# Patient Record
Sex: Female | Born: 1940 | Race: White | Hispanic: No
Health system: Midwestern US, Community
[De-identification: ages and names within clinical notes are randomized; demographics above are authoritative.]

## PROBLEM LIST (undated history)

## (undated) DIAGNOSIS — R922 Inconclusive mammogram: Secondary | ICD-10-CM

## (undated) DIAGNOSIS — E7800 Pure hypercholesterolemia, unspecified: Secondary | ICD-10-CM

## (undated) DIAGNOSIS — E119 Type 2 diabetes mellitus without complications: Secondary | ICD-10-CM

## (undated) DIAGNOSIS — Z87898 Personal history of other specified conditions: Secondary | ICD-10-CM

## (undated) DIAGNOSIS — J209 Acute bronchitis, unspecified: Secondary | ICD-10-CM

## (undated) DIAGNOSIS — R7301 Impaired fasting glucose: Secondary | ICD-10-CM

## (undated) DIAGNOSIS — R059 Cough, unspecified: Secondary | ICD-10-CM

## (undated) DIAGNOSIS — M8589 Other specified disorders of bone density and structure, multiple sites: Secondary | ICD-10-CM

---

## 2010-08-09 LAB — HM COLONOSCOPY

## 2012-07-29 LAB — HM MAMMOGRAPHY

## 2013-08-07 LAB — HM DEXA SCAN

## 2013-08-10 LAB — HM DEXA SCAN
Amb DEXA, External: -1.1
BONE DENSITY, EXTERNAL: -1.1

## 2014-02-08 LAB — HM MAMMOGRAPHY

## 2014-03-10 LAB — AMB EXT HGBA1C: Hemoglobin A1c, External: 6.1

## 2014-06-29 NOTE — Progress Notes (Unsigned)
HISTORY OF PRESENT ILLNESS  Melissa Mclean is a 74 y.o. female.  HPI    ROS    Physical Exam    ASSESSMENT and PLAN  {ASSESSMENT/PLAN:19072}

## 2014-06-30 ENCOUNTER — Ambulatory Visit: Admit: 2014-06-30 | Discharge: 2014-06-30 | Payer: MEDICARE | Attending: Internal Medicine | Primary: Internal Medicine

## 2014-06-30 DIAGNOSIS — E119 Type 2 diabetes mellitus without complications: Secondary | ICD-10-CM

## 2014-06-30 NOTE — Progress Notes (Signed)
Subjective:      HPI:  Melissa StadeCarol Mclean is a 74 y.o. female who had concerns including Leg Swelling.. Left leg edema   Bad URI in [place] this winter took zpak then treated with 10 day course then an inhaler and prednisone. On prednisone for 5 days then wheeze and infection resolved. Then noticed ankle swelling and saw doctor had lab- elctrolytes fine. Stopped furosemide treated instead with aldactone/hct. Request[facility] be [facility]. No shortness of breath now.  Had injections in knees for 6 weeks with 6 weeks pt- back in the pool.  Seeing foot doctor. Left hip replacement leg a little lower. Weight stable  Sees dr Simone Curiameghan buhler- [phone]      No Known Allergies     Family History   Problem Relation Age of Onset   ??? Hypertension Mother    ??? Colon Cancer Sister      History     Social History   ??? Marital Status: MARRIED     Spouse Name: N/A   ??? Number of Children: N/A   ??? Years of Education: N/A     Social History Main Topics   ??? Smoking status: Former Smoker -- 0.10 packs/day for 15 years     Types: Cigarettes     Quit date: 06/29/2012   ??? Smokeless tobacco: Not on file   ??? Alcohol Use: No   ??? Drug Use: Not on file   ??? Sexual Activity: Not on file     Other Topics Concern   ??? None     Social History Narrative     Past Surgical History   Procedure Laterality Date   ??? Hx breast biopsy     ??? Hx lap cholecystectomy     ??? Hx parathyroidectomy     ??? Hx hip replacement       Past Medical History   Diagnosis Date   ??? Osteoporosis    ??? Overweight    ??? Diabetes mellitus (HCC)    ??? Hypertension    ??? High cholesterol    ??? Hypothyroid      Current Outpatient Prescriptions   Medication Sig Dispense Refill   ??? levothyroxine (SYNTHROID) 125 mcg tablet Take  by mouth Daily (before breakfast).     ??? spironolactone-hydrochlorothiazide (ALDACTAZIDE) 25-25 mg per tablet Take 1 Tab by mouth daily.     ??? multivitamin (ONE A DAY) tablet Take 1 Tab by mouth daily.      ??? cholecalciferol (VITAMIN D3) 1,000 unit cap Take  by mouth daily.     ??? cyanocobalamin (VITAMIN B-12) 1,000 mcg sublingual tablet Take 1,000 mcg by mouth daily.     ??? losartan (COZAAR) 100 mg tablet Take 100 mg by mouth daily.     ??? B.infantis-B.ani-B.long-B.bifi 10-15 mg TbEC Take  by mouth.         REVIEW OF SYSTEMS:  Constitrutional:  No fatigue no fever no weight loss  Sleep: no sleep apnea no insomnia  Eyes:  No blurry vision or eye pain  ENT: No dysphagia  Resp: No SOB, no asthma, no wheeze  Cardiac:  No chest pain, palpitations or syncope  GI:  No nausea or vomiting, no blood in stool  GU:  No dysuria or hematuria  Endo: thyroid and diabetes a1c 6.1  Heme-lymph: No ecchymosis or bleeding, no lymphadenopathy  Skin:  No rash, no change in moles  Neuro: No headaches, weakness, or vertigo  MS- knee arthritis with hyalgan injection s/p  THR  Psych: No Depression, no anxiety, No dementia  Allergy/immunologic: No frequent infections. No hayfever    BP 132/84 mmHg   Pulse 103   Ht  (1.727 m)   Wt 274 lb 8 oz (124.512 kg)   BMI 41.75 kg/m2   SpO2 94%    Objective:   PHYSICAL EXAM:  Const: WDWN no acute distress  Skin: no rash no change in moles  HEENT: mouth clear, pharynx neg  Neck: no nodes supple  Breast:  Lungs: clear, no wheeze  Heart:: RRR S1S2 no murmur  Abdomen: Soft nontender non distended no hepatosplenomegaly   GU: Not performed  Musculoskeletal:    C-spine:   L-spine   Joints: THR   Muscles:    No results found for any previous visit.  No results found for this or any previous visit.    Assessment/Plan:   . Darina was in [place] this winter and recovering from bronchitis and episode of bronchospasm. Treated with prednisone now resolved but developed edema. Furosemide changed to aldactone/hct much better now. Will see in follow up. Will review lab from [facility] when it arrives. Call if chagnes.    Patient Active Problem List   Diagnosis Code   ??? Diabetes mellitus (HCC) E11.9   ??? Hypertension I10    ??? Hypothyroid E03.9   ??? Osteoporosis M81.0   ??? Edema R60.9       Follow-up Disposition: Not on File     Edwin Cap, MD

## 2014-06-30 NOTE — Progress Notes (Unsigned)
Subjective:      HPI:  Melissa Mclean is a 74 y.o. female who had no chief complaint listed for this encounter..    Allergies no known allergies     Family History   Problem Relation Age of Onset   ??? Hypertension Mother    ??? Colon Cancer Sister      History     Social History   ??? Marital Status: N/A     Spouse Name: N/A   ??? Number of Children: N/A   ??? Years of Education: N/A     Social History Main Topics   ??? Smoking status: Never Smoker    ??? Smokeless tobacco: Not on file   ??? Alcohol Use: No   ??? Drug Use: Not on file   ??? Sexual Activity: Not on file     Other Topics Concern   ??? Not on file     Social History Narrative   ??? No narrative on file     Past Surgical History   Procedure Laterality Date   ??? Hx breast biopsy       Past Medical History   Diagnosis Date   ??? Diabetes mellitus (HCC)    ??? Hypertension    ??? High cholesterol    ??? Hypothyroid    ??? Osteoporosis    ??? Overweight      Current Outpatient Prescriptions   Medication Sig Dispense Refill   ??? cholecalciferol (VITAMIN D3) 1,000 unit cap Take  by mouth daily.     ??? cyanocobalamin (VITAMIN B-12) 1,000 mcg sublingual tablet Take 1,000 mcg by mouth daily.     ??? losartan (COZAAR) 100 mg tablet Take 100 mg by mouth daily.     ??? furosemide (LASIX) 10 mg/mL oral solution Take  by mouth daily.     ??? cyclobenzaprine (FLEXERIL) 10 mg tablet Take  by mouth three (3) times daily as needed for Muscle Spasm(s).         REVIEW OF SYSTEMS:  Constitrutional:  No fatigue no fever no weight loss  Sleep: no sleep apnea no insomnia  Eyes:  No blurry vision or eye pain  ENT: No dysphagia  Resp: No SOB, no asthma, no wheeze  Cardiac:  No chest pain, palpitations or syncope  GI:  No nausea or vomiting, no blood in stool  GU:  No dysuria or hematuria  Endo: No hypothyroid no diabetes  Heme-lymph: No ecchymosis or bleeding, no lymphadenopathy  Skin:  No rash, no change in moles  Neuro: No headaches, weakness, or vertigo  Psych: No Depression, no anxiety, No dementia   Allergy/immunologic: No frequent infections. No hayfever    BP 122/88 mmHg   Pulse 96   Wt 263 lb (119.296 kg)   SpO2 97%    Objective:   PHYSICAL EXAM:  Const: WDWN no acute distress  Skin: no rash no change in moles  HEENT: mouth clear, pharynx neg  Neck: no nodes supple  Breast:  Lungs: clear, no wheeze  Heart:: RRR S1S2 no murmur  Abdomen: Soft nontender non distended no hepatosplenomegaly   GU: Not performed  Musculoskeletal:    C-spine:   L-spine   Joints:   Muscles:  Lymphadenopathy: no nodes  Rectal:   Neuro: non focal    cranial nerves:   Sensory:   Motor:   Gait:  Psych: no dementia no depression no anxiety    No results found for any previous visit.  No results found for this or any  previous visit.    Assessment/Plan:   {Assessment and Plan:19072}.    There is no problem list on file for this patient.      Follow-up Disposition: Not on File     Edwin CapEMILY Jamaree Hosier, MD

## 2014-07-29 MED ORDER — SPIRONOLACTON-HYDROCHLOROTHIAZ 25 MG-25 MG TAB
25-25 mg | ORAL_TABLET | Freq: Every day | ORAL | Status: DC
Start: 2014-07-29 — End: 2015-01-26

## 2014-07-29 NOTE — Telephone Encounter (Signed)
rx escribed

## 2014-07-29 NOTE — Telephone Encounter (Signed)
To MD

## 2014-07-29 NOTE — Telephone Encounter (Signed)
Yes please send in 90 x 3 refills 1 every day to pharmacy

## 2014-07-29 NOTE — Telephone Encounter (Signed)
Pt called stating that she needs Dr Roger ShelterGordon to call in her spironolactone-hztz 25-25 tabs. States that [place] in [place] do this, but she is in [place] now.  As[facility] [address]

## 2014-08-09 ENCOUNTER — Ambulatory Visit: Admit: 2014-08-09 | Discharge: 2014-08-09 | Payer: MEDICARE | Attending: Internal Medicine | Primary: Internal Medicine

## 2014-08-09 DIAGNOSIS — E119 Type 2 diabetes mellitus without complications: Secondary | ICD-10-CM

## 2014-08-09 MED ORDER — METFORMIN SR 750 MG 24 HR TABLET
750 mg | ORAL_TABLET | Freq: Every day | ORAL | Status: DC
Start: 2014-08-09 — End: 2015-01-26

## 2014-08-09 NOTE — Progress Notes (Addendum)
Chief Complaint   Patient presents with   ??? Hypotension       HPI:   Edema resolved, bronchospasm resolved  Had lab on aldactone/hct 3/16 cmp nl bcc nl k 4.0 12/15 celiac neg, chol 262/64/166/158 a1c 6.1 tsh 2.1 cbc nl b12 440 d 37   Doing well with diet and exercise   Joining weight watchers    ROS- edema resolved  bronchospasm resolved  No hcest pain no shortness of breath  Due for colonscopy - + polyps discussed    HISTORIES & HABITS  Medical History:   Past Medical History   Diagnosis Date   ??? Osteoporosis    ??? Overweight    ??? Diabetes mellitus (HCC)    ??? Hypertension    ??? High cholesterol    ??? Hypothyroid    ??? Osteoarthritis      had bilateral injections hyalgan in knees     Surgery History:   Past Surgical History   Procedure Laterality Date   ??? Hx breast biopsy     ??? Hx lap cholecystectomy     ??? Hx parathyroidectomy     ??? Hx hip replacement     ??? Hx colonoscopy  11/2013     Family History:   Family History   Problem Relation Age of Onset   ??? Hypertension Mother    ??? Colon Cancer Sister      History     Social History   ??? Marital Status: MARRIED     Spouse Name: N/A   ??? Number of Children: N/A   ??? Years of Education: N/A     Social History Main Topics   ??? Smoking status: Former Smoker -- 0.10 packs/day for 15 years     Types: Cigarettes     Quit date: 06/29/2012   ??? Smokeless tobacco: Not on file   ??? Alcohol Use: No   ??? Drug Use: Not on file   ??? Sexual Activity: Not on file     Other Topics Concern   ??? None     Social History Narrative     Health Maintenance   Topic Date Due   ??? LIPID PANEL Q1  December 04, 1940   ??? FOOT EXAM Q1  05/12/1951   ??? MICROALBUMIN Q1  05/12/1951   ??? EYE EXAM RETINAL OR DILATED Q1  05/12/1951   ??? DTaP/Tdap/Td series (1 - Tdap) 05/11/1962   ??? FOBT Q 1 YEAR AGE 70-75  05/12/1991   ??? GLAUCOMA SCREENING Q2Y  05/11/2006   ??? MEDICARE YEARLY EXAM  05/11/2006   ??? HEMOGLOBIN A1C Q6M  09/09/2014   ??? INFLUENZA AGE 75 TO ADULT  10/25/2014   ??? Pneumococcal 65+ Low/Medium Risk (2 of 2 - PPSV23) 11/30/2014    ??? BREAST CANCER SCRN MAMMOGRAM  02/09/2016   ??? OSTEOPOROSIS SCREENING (DEXA)  Completed   ??? ZOSTER VACCINE AGE 8>  Completed     No Known Allergies    Current outpatient prescriptions:   ???  metFORMIN ER (GLUCOPHAGE XR) 750 mg tablet, Take 1 Tab by mouth daily., Disp: 90 Tab, Rfl: 3  ???  spironolactone-hydrochlorothiazide (ALDACTAZIDE) 25-25 mg per tablet, Take 1 Tab by mouth daily., Disp: 90 Tab, Rfl: 3  ???  levothyroxine (SYNTHROID) 125 mcg tablet, Take  by mouth Daily (before breakfast)., Disp: , Rfl:   ???  multivitamin (ONE A DAY) tablet, Take 1 Tab by mouth daily., Disp: , Rfl:   ???  B.infantis-B.ani-B.long-B.bifi 10-15 mg TbEC, Take  by  mouth., Disp: , Rfl:   ???  cholecalciferol (VITAMIN D3) 1,000 unit cap, Take  by mouth daily., Disp: , Rfl:   ???  cyanocobalamin (VITAMIN B-12) 1,000 mcg sublingual tablet, Take 1,000 mcg by mouth daily., Disp: , Rfl:   ???  losartan (COZAAR) 100 mg tablet, Take 100 mg by mouth daily., Disp: , Rfl:       BP 120/70 mmHg   Pulse 104   Ht 5\' 8"  (1.727 m)   Wt 123.378 kg (272 lb)   BMI 41.37 kg/m2   SpO2 97%  Physical Exam  General: well nourished, well hydrated, no acute distress  Head: normocephalic  Eye: conjunctivae and lids normal  ZOX:WRUEENT:Oral Cavity: no lesion, pharynx normal  Neck: supple, no masses, no lymphadenopathy  Respiratory: no rales, rhonchi, or wheezes  Cardiovascular: RRR, S1 and S2 normal, no murmur  Peripheral circulation: no cyanosis, clubbing, edema  Skin: no rashes, change in moles  Neurologic: non focal  Cranial nerves: II - XII grossly intact      Chief Complaint   Patient presents with   ??? Hypotension       Assessment-Plan:bp too low on aldactixzide- now breaking in half- feels better  High cholesterol discussed diet nad books rec- weigh tloss   Will repeat lab a1c and lipid 3 months  Start metformin er 750 with dinner- risk of loose bowels discussed    5.16 255/65/155/168 a1c 6.0 cmp nl tsh 2.0  11.16 cmp nl a1c 6.1 chol 252/58/163/157 tsh 1.2 ca 10.1   Spoke to pt lightheaded on metofrmin may stop  Lost 5 pounds on weight watchers      Encounter Diagnoses     ICD-10-CM ICD-9-CM   1. Type 2 diabetes mellitus without complication (HCC) E11.9 250.00   2. Essential hypertension with goal blood pressure less than 130/80 I10 401.9   3. Hypothyroidism due to Hashimoto's thyroiditis E03.8 244.8    E06.3 245.2   4. Osteoporosis M81.0 733.00   5. Edema, unspecified type R60.9 782.3     Orders Placed This Encounter   ??? HM MAMMOGRAPHY     This external order was created through the Results Console.   ??? HEMOGLOBIN A1C W/O EAG   ??? LIPID PANEL   ??? TSH 3RD GENERATION   ??? METABOLIC PANEL, COMPREHENSIVE   ??? AMB EXT HGBA1C     This external order was created through the Results Console.   ??? HM COLONOSCOPY     This external order was created through the Results Console.   ??? metFORMIN ER (GLUCOPHAGE XR) 750 mg tablet     Sig: Take 1 Tab by mouth daily.     Dispense:  90 Tab     Refill:  3       Sincerely,    Edwin CapEmily Mattalynn Crandle, MD  Note Signed Electronically

## 2014-08-09 NOTE — Patient Instructions (Signed)
Check lab in 3 months  Weight loss

## 2014-08-26 LAB — METABOLIC PANEL, COMPREHENSIVE
A-G Ratio: 1.7 Ratio (ref 0.9–2.4)
ALT (SGPT): 19 IU/L (ref 6–40)
AST (SGOT): 17 IU/L (ref 10–35)
Albumin: 4.2 g/dL (ref 3.3–5.1)
Alk. phosphatase: 61 IU/L (ref 33–130)
BUN/Creatinine ratio: 33.3 (ref 5.3–50.0)
BUN: 26 mg/dL — ABNORMAL HIGH (ref 7–25)
Bilirubin, total: 0.6 mg/dL (ref 0.2–1.2)
CO2: 26 mmol/L (ref 21–33)
Calcium: 10.1 mg/dL (ref 8.6–10.4)
Chloride: 102 mmol/L (ref 98–110)
Creatinine: 0.78 mg/dL (ref 0.50–1.30)
Estimated GFR: 72 (ref >60)
Globulin: 2.5 g/dL (ref 1.8–3.6)
Glucose: 117 mg/dL — ABNORMAL HIGH (ref 65–99)
Potassium: 4.8 mmol/L (ref 3.5–5.5)
Protein, total: 6.7 g/dL (ref 6.2–8.3)
Sodium: 137 mmol/L (ref 135–146)
eGFR If African American: 88 (ref >60)

## 2014-08-26 LAB — LIPID PANEL
Cholesterol, total: 254 mg/dL — ABNORMAL HIGH (ref 125–200)
HDL Chol (%): 25.6 % (ref >15)
HDL Cholesterol: 65 mg/dL (ref >=46)
LDL, calculated: 155 mg/dL — ABNORMAL HIGH (ref <130)
Non-HDL Cholesterol: 189 mg/dL
T. Chol/HDL Ratio: 3.9 Ratio (ref <5.00)
Triglyceride: 168 mg/dL — ABNORMAL HIGH (ref <150)
VLDL, calculated: 34 (ref 8–35)

## 2014-08-26 LAB — TSH 3RD GENERATION: TSH: 2.01 u[IU]/mL (ref 0.36–4.42)

## 2014-08-26 LAB — ESTIMATED AVERAGE GLUCOSE (SHIEL ONLY): Estimated average glucose: 126 mg/dL

## 2014-08-26 LAB — HEMOGLOBIN A1C W/O EAG: Hemoglobin A1c: 6 % (ref 4.0–6.0)

## 2014-09-16 ENCOUNTER — Telehealth

## 2014-09-16 NOTE — Addendum Note (Signed)
Addended by: Vicie Mutters on: 09/16/2014 04:22 PM      Modules accepted: Orders

## 2014-09-16 NOTE — Addendum Note (Signed)
Addended by: Vicie Mutters on: 09/16/2014 03:29 PM      Modules accepted: Orders

## 2014-09-16 NOTE — Telephone Encounter (Signed)
Pt called to request script for annual mammogram and diagnostic mammogram. She asked it to be faxed to [phone].

## 2014-09-16 NOTE — Telephone Encounter (Signed)
Yes yes

## 2014-09-16 NOTE — Telephone Encounter (Signed)
yes

## 2014-09-16 NOTE — Telephone Encounter (Signed)
mammo ordered and faxed

## 2014-09-16 NOTE — Telephone Encounter (Signed)
To md, ok to order

## 2014-09-17 ENCOUNTER — Telehealth

## 2014-09-17 NOTE — Telephone Encounter (Signed)
See previous encounter.    Megan from [facility] called stating that they need script for [place] to go with diagnostic mammo.

## 2014-09-17 NOTE — Telephone Encounter (Signed)
faxed

## 2014-09-21 NOTE — Telephone Encounter (Signed)
Maure[facility]ivian at [facility] called. Wanted to make Dr Roger ShelterGordon aware that patient has not returned any of her phone calls regarding scheduling her diagnostic mammo.

## 2014-10-13 ENCOUNTER — Encounter

## 2014-10-26 LAB — HM MAMMOGRAPHY

## 2014-11-08 MED ORDER — LOSARTAN 100 MG TAB
100 mg | ORAL_TABLET | Freq: Every day | ORAL | 3 refills | Status: DC
Start: 2014-11-08 — End: 2015-01-26

## 2014-11-24 ENCOUNTER — Encounter

## 2014-12-01 ENCOUNTER — Ambulatory Visit: Attending: Internal Medicine | Primary: Internal Medicine

## 2014-12-02 NOTE — Progress Notes (Signed)
A user error has taken place: encounter opened in error, closed for administrative reasons.

## 2015-01-19 ENCOUNTER — Telehealth

## 2015-01-19 NOTE — Telephone Encounter (Signed)
Printed -please mail

## 2015-01-19 NOTE — Telephone Encounter (Signed)
Patient called requesting a script for labs to please be mailed to her home.

## 2015-01-20 NOTE — Telephone Encounter (Signed)
Mailed to pt's home

## 2015-01-25 LAB — LIPID PANEL
Cholesterol, total: 252 mg/dL — ABNORMAL HIGH (ref 125–200)
HDL Chol (%): 23 % (ref >15)
HDL Cholesterol: 58 mg/dL (ref >=46)
LDL, calculated: 163 mg/dL — ABNORMAL HIGH (ref <130)
Non-HDL Cholesterol: 194 mg/dL
T. Chol/HDL Ratio: 4.3 Ratio (ref <5.00)
Triglyceride: 157 mg/dL — ABNORMAL HIGH (ref <150)
VLDL, calculated: 31 (ref 8–35)

## 2015-01-25 LAB — METABOLIC PANEL, COMPREHENSIVE
A-G Ratio: 1.8 Ratio (ref 0.9–2.4)
ALT (SGPT): 13 IU/L (ref 6–40)
AST (SGOT): 13 IU/L (ref 10–35)
Albumin: 4.3 g/dL (ref 3.3–5.1)
Alk. phosphatase: 71 IU/L (ref 33–130)
BUN/Creatinine ratio: 32.1 (ref 5.3–50.0)
BUN: 25 mg/dL (ref 7–25)
Bilirubin, total: 0.9 mg/dL (ref 0.2–1.2)
CO2: 28 mmol/L (ref 21–33)
Calcium: 10.1 mg/dL (ref 8.6–10.4)
Chloride: 102 mmol/L (ref 98–110)
Creatinine: 0.78 mg/dL (ref 0.50–1.30)
Estimated GFR: 72 (ref >60)
Globulin: 2.4 g/dL (ref 1.8–3.6)
Glucose: 112 mg/dL — ABNORMAL HIGH (ref 65–99)
Potassium: 4.4 mmol/L (ref 3.5–5.5)
Protein, total: 6.7 g/dL (ref 6.2–8.3)
Sodium: 138 mmol/L (ref 135–146)
eGFR If African American: 88 (ref >60)

## 2015-01-25 LAB — ESTIMATED AVERAGE GLUCOSE (SHIEL ONLY): Estimated average glucose: 128 mg/dL

## 2015-01-25 LAB — TSH 3RD GENERATION: TSH: 1.21 u[IU]/mL (ref 0.36–4.42)

## 2015-01-25 LAB — HEMOGLOBIN A1C W/O EAG: Hemoglobin A1c: 6.1 % — ABNORMAL HIGH (ref 4.0–6.0)

## 2015-01-26 ENCOUNTER — Ambulatory Visit: Admit: 2015-01-26 | Discharge: 2015-01-26 | Payer: MEDICARE | Attending: Internal Medicine | Primary: Internal Medicine

## 2015-01-26 DIAGNOSIS — Z0000 Encounter for general adult medical examination without abnormal findings: Secondary | ICD-10-CM

## 2015-01-26 MED ORDER — LOSARTAN 100 MG TAB
100 mg | ORAL_TABLET | Freq: Every day | ORAL | 3 refills | Status: DC
Start: 2015-01-26 — End: 2015-08-08

## 2015-01-26 MED ORDER — LEVOTHYROXINE 125 MCG TAB
125 mcg | ORAL_TABLET | Freq: Every day | ORAL | 3 refills | Status: DC
Start: 2015-01-26 — End: 2016-04-10

## 2015-01-26 MED ORDER — METFORMIN SR 750 MG 24 HR TABLET
750 mg | ORAL_TABLET | Freq: Every day | ORAL | 3 refills | Status: DC
Start: 2015-01-26 — End: 2015-12-12

## 2015-01-26 MED ORDER — SPIRONOLACTON-HYDROCHLOROTHIAZ 25 MG-25 MG TAB
25-25 mg | ORAL_TABLET | Freq: Every day | ORAL | 3 refills | Status: DC
Start: 2015-01-26 — End: 2016-02-10

## 2015-01-26 NOTE — Progress Notes (Addendum)
This is a Subsequent Medicare Annual Wellness Visit providing Personalized Prevention Plan Services (PPPS) (Performed 12 months after initial AWV and PPPS )    I have reviewed the patient's medical history in detail and updated the computerized patient record.     History     Past Medical History   Diagnosis Date   ??? Colon polyps    ??? Depression    ??? Diabetes mellitus (HCC)    ??? High cholesterol    ??? Hypertension    ??? Hypothyroid    ??? Osteoarthritis      had bilateral injections hyalgan in knees   ??? Osteoporosis    ??? Overweight       Past Surgical History   Procedure Laterality Date   ??? Hx breast biopsy     ??? Hx lap cholecystectomy     ??? Hx parathyroidectomy     ??? Hx hip replacement     ??? Hx colonoscopy  11/2013     Current Outpatient Prescriptions   Medication Sig Dispense Refill   ??? metFORMIN ER (GLUCOPHAGE XR) 750 mg tablet Take 1 Tab by mouth daily. 90 Tab 3   ??? spironolactone-hydrochlorothiazide (ALDACTAZIDE) 25-25 mg per tablet Take 1 Tab by mouth daily. 90 Tab 3   ??? levothyroxine (SYNTHROID) 125 mcg tablet Take 1 Tab by mouth Daily (before breakfast). 90 Tab 3   ??? losartan (COZAAR) 100 mg tablet Take 1 Tab by mouth daily. 90 Tab 3   ??? multivitamin (ONE A DAY) tablet Take 1 Tab by mouth daily.     ??? B.infantis-B.ani-B.long-B.bifi 10-15 mg TbEC Take  by mouth.     ??? cholecalciferol (VITAMIN D3) 1,000 unit cap Take  by mouth daily.     ??? cyanocobalamin (VITAMIN B-12) 1,000 mcg sublingual tablet Take 1,000 mcg by mouth daily.       No Known Allergies  Family History   Problem Relation Age of Onset   ??? Hypertension Mother    ??? Colon Cancer Sister      Social History   Substance Use Topics   ??? Smoking status: Former Smoker     Packs/day: 0.10     Years: 15.00     Types: Cigarettes     Quit date: 06/29/2012   ??? Smokeless tobacco: Not on file   ??? Alcohol use No     Patient Active Problem List   Diagnosis Code   ??? Diabetes mellitus (HCC) E11.9   ??? Hypertension I10   ??? Hypothyroid E03.9   ??? Osteoporosis M81.0    ??? Edema R60.9   ??? Colon polyps K63.5       Depression Risk Factor Screening:   No flowsheet data found.  Alcohol Risk Factor Screening:   On any occasion during the past 3 months, have you had more than 3 drinks containing alcohol?  No    Do you average more than 7 drinks per week?  No      Functional Ability and Level of Safety:     Hearing Loss   normal-to-mild    Activities of Daily Living   Self-care.   Requires assistance with: no ADLs    Fall Risk   No flowsheet data found.  Abuse Screen   Patient is not abused    Review of Systems   Pertinent items are noted in HPI.    Physical Examination     Evaluation of Cognitive Function:  Mood/affect:  neutral  Appearance: age appropriate  Family member/caregiver  input: none    Visit Vitals   ??? BP 124/68   ??? Pulse 78   ??? Ht 5\' 8"  (1.727 m)   ??? Wt 114.8 kg (253 lb)   ??? SpO2 98%   ??? BMI 38.47 kg/m2       HPI feeling well 20 pound weight loss on weight watchers  No change in a1c or cholesterol  Taking 1/2 metformin and aldactizide  No edema  [place]  going to [place] for winter  Flu hsot today- other immunizations UTD  Last mamogram 6/16- repeat 1 year DEXA 2015  colonsocopy due this year  Eye appt this week      Review of System  General: denies fatigue, sleep problems, weight change  Eyes: denies eye pain, change in vision, blurring  ENT: denies nosebleeds, sore throat, sinus  Cardiovascular: denies chest pains, palpitations, dyspnea on exertion, edema  Respiratory: denies cough, SOB, wheeze  Gastrointestinal: denies abdominal pain, change in bowel habits, GERD  GU: denies UTI history, nocturia  Musculoskeletal: denies back pain, joint pain  Skin: denies rash, change in moles, excema,    Neuro: nl ambulation, cognitive  Psychiatric:  No memory change, mood stable  HemeLymphatic: denies abnormal bruising, bleeding, enlarged lymph nodes, anemia  Allergic/Immunologic: denies persistent infections, HIV exposure, hay fever,   Exercise:walking   Diet: healthy vegetables chicken eggs some bread and pasta  Reports varicella    Physical Exam  General: well nourished, well hydrated, no acute distress  Head: normocephalic  Eye: conjunctivae and lids normal  ZOX:WRUEENT:Oral Cavity: no lesion, pharynx normal  Neck: supple, no masses, no lymphadenopathy  Respiratory: no rales, rhonchi, or wheezes  Cardiovascular: RRR, S1 and S2 normal, no murmur  Breast: no mass no nodes 11/16  Peripheral circulation: no cyanosis, clubbing, edema  Gastrointestinal: NTND no HSM  Skin: no rashes, change in moles  Neurologic: non focal  Cranial nerves: II - XII grossly intact  AVW:UJWJXBJYNSE:Judgement and insight: intact  WGN:FAOZSK:Head and neck: normal alignment and mobility  Digit and nail: no clubbing, cyanosis, petechiae, or nodes  Spine, rib, pelvis: normal alignment, no deformity  Gait and station: normal    11/16 chol 252/58/163/157 a1c 6.1 tsh 1.2 cmp nl   06/13/15 ldl particle 2049 ldl 164 chol 256/33/164/163 tsh 2.0 a1c 5.9  Check lab before next visit  If more than 10 pound weight loss- cut losartan 1/2  Continue weight loss diet discussed  Check intact PTH next lab  Flu shot    Patient Care Team:  Edwin CapEmily Kaitlynn Tramontana, MD as PCP - General (Internal Medicine)    Advice/Referrals/Counseling   Education and counseling provided:  Pneumococcal Vaccine  Screening Mammography  Colorectal cancer screening tests  Bone mass measurement (DEXA)  Screening for glaucoma  Diabetes screening test    Assessment/Plan     Encounter Diagnoses   Name Primary?   ??? Routine general medical examination at a health care facility    ??? Colon polyps    ??? Type 2 diabetes mellitus without complication, without long-term current use of insulin (HCC)    ??? Essential hypertension    ??? Hypothyroidism due to Hashimoto's thyroiditis    ??? Osteoporosis    ??? Edema, unspecified type    ??? Screening for alcoholism    ??? Essential hypertension with goal blood pressure less than 130/80      Orders Placed This Encounter   ??? HM MAMMOGRAPHY    ??? INFLUENZA VIRUS VACCINE, HIGH DOSE SEASONAL, PRESERVATIVE  FREE   ??? VITAMIN D, 25 HYDROXY   ??? METABOLIC PANEL, COMPREHENSIVE   ??? TSH 3RD GENERATION   ??? HEMOGLOBIN A1C W/O EAG   ??? LIPID PANEL   ??? CBC WITH AUTOMATED DIFF   ??? VITAMIN B12   ??? PTH INTACT   ??? metFORMIN ER (GLUCOPHAGE XR) 750 mg tablet   ??? spironolactone-hydrochlorothiazide (ALDACTAZIDE) 25-25 mg per tablet   ??? levothyroxine (SYNTHROID) 125 mcg tablet   ??? losartan (COZAAR) 100 mg tablet   .

## 2015-01-26 NOTE — Progress Notes (Deleted)
This is a Subsequent Medicare Annual Wellness Visit providing Personalized Prevention Plan Services (PPPS) (Performed 12 months after initial AWV and PPPS )    I have reviewed the patient's medical history in detail and updated the computerized patient record.     History     Past Medical History   Diagnosis Date   ??? Colon polyps    ??? Depression    ??? Diabetes mellitus (HCC)    ??? High cholesterol    ??? Hypertension    ??? Hypothyroid    ??? Osteoarthritis      had bilateral injections hyalgan in knees   ??? Osteoporosis    ??? Overweight       Past Surgical History   Procedure Laterality Date   ??? Hx breast biopsy     ??? Hx lap cholecystectomy     ??? Hx parathyroidectomy     ??? Hx hip replacement     ??? Hx colonoscopy  11/2013     Current Outpatient Prescriptions   Medication Sig Dispense Refill   ??? metFORMIN ER (GLUCOPHAGE XR) 750 mg tablet Take 1 Tab by mouth daily. 90 Tab 3   ??? spironolactone-hydrochlorothiazide (ALDACTAZIDE) 25-25 mg per tablet Take 1 Tab by mouth daily. 90 Tab 3   ??? levothyroxine (SYNTHROID) 125 mcg tablet Take 1 Tab by mouth Daily (before breakfast). 90 Tab 3   ??? losartan (COZAAR) 100 mg tablet Take 1 Tab by mouth daily. 90 Tab 3   ??? multivitamin (ONE A DAY) tablet Take 1 Tab by mouth daily.     ??? B.infantis-B.ani-B.long-B.bifi 10-15 mg TbEC Take  by mouth.     ??? cholecalciferol (VITAMIN D3) 1,000 unit cap Take  by mouth daily.     ??? cyanocobalamin (VITAMIN B-12) 1,000 mcg sublingual tablet Take 1,000 mcg by mouth daily.       No Known Allergies  Family History   Problem Relation Age of Onset   ??? Hypertension Mother    ??? Colon Cancer Sister      Social History   Substance Use Topics   ??? Smoking status: Former Smoker     Packs/day: 0.10     Years: 15.00     Types: Cigarettes     Quit date: 06/29/2012   ??? Smokeless tobacco: Not on file   ??? Alcohol use No     Patient Active Problem List   Diagnosis Code   ??? Diabetes mellitus (HCC) E11.9   ??? Hypertension I10   ??? Hypothyroid E03.9   ??? Osteoporosis M81.0    ??? Edema R60.9   ??? Colon polyps K63.5       Depression Risk Factor Screening:   No flowsheet data found.  Alcohol Risk Factor Screening:   On any occasion during the past 3 months, have you had more than 3 drinks containing alcohol?  No    Do you average more than 7 drinks per week?  No      Functional Ability and Level of Safety:     Hearing Loss   mild-to-moderate    Activities of Daily Living   Self-care.   Requires assistance with: no ADLs    Fall Risk   No flowsheet data found.  Abuse Screen   Patient is not abused    Review of Systems   Pertinent items are noted in HPI.    Physical Examination     Chief Complaint   Patient presents with   ??? Annual Exam  Immunization History   Administered Date(s) Administered   ??? Influenza High Dose Vaccine PF 01/26/2015   ??? Pneumococcal Conjugate (PCV-13) 11/29/2013   ??? Pneumococcal Polysaccharide (PPSV-23) 01/25/2006   ??? Zoster Vaccine, Live 06/29/2013       HISTORIES & HABITS  Medical History:   Past Medical History   Diagnosis Date   ??? Colon polyps    ??? Depression    ??? Diabetes mellitus (HCC)    ??? High cholesterol    ??? Hypertension    ??? Hypothyroid    ??? Osteoarthritis      had bilateral injections hyalgan in knees   ??? Osteoporosis    ??? Overweight      Surgery History:   Past Surgical History   Procedure Laterality Date   ??? Hx breast biopsy     ??? Hx lap cholecystectomy     ??? Hx parathyroidectomy     ??? Hx hip replacement     ??? Hx colonoscopy  11/2013     Family History:   Family History   Problem Relation Age of Onset   ??? Hypertension Mother    ??? Colon Cancer Sister      Social History     Social History   ??? Marital status: MARRIED     Spouse name: N/A   ??? Number of children: N/A   ??? Years of education: N/A     Social History Main Topics   ??? Smoking status: Former Smoker     Packs/day: 0.10     Years: 15.00     Types: Cigarettes     Quit date: 06/29/2012   ??? Smokeless tobacco: None   ??? Alcohol use No   ??? Drug use: None   ??? Sexual activity: Not Asked      Other Topics Concern   ??? None     Social History Narrative     Health Maintenance   Topic Date Due   ??? FOOT EXAM Q1  05/12/1950   ??? MICROALBUMIN Q1  05/12/1950   ??? EYE EXAM RETINAL OR DILATED Q1  [birth year]12/1950   ??? DTaP/Tdap/Td series (1 - Tdap) 05/11/1962   ??? FOBT Q 1 YEAR AGE 82-75  05/12/1991   ??? Pneumococcal 65+ Low/Medium Risk (2 of 2 - PPSV23) 11/30/2014   ??? HEMOGLOBIN A1C Q6M  07/24/2015   ??? LIPID PANEL Q1  01/24/2016   ??? MEDICARE YEARLY EXAM  01/27/2016   ??? BREAST CANCER SCRN MAMMOGRAM  10/25/2016   ??? GLAUCOMA SCREENING Q2Y  01/18/2017   ??? OSTEOPOROSIS SCREENING (DEXA)  Completed   ??? ZOSTER VACCINE AGE 38>  Completed   ??? INFLUENZA AGE 35 TO ADULT  Completed     No Known Allergies    Current Outpatient Prescriptions:   ???  metFORMIN ER (GLUCOPHAGE XR) 750 mg tablet, Take 1 Tab by mouth daily., Disp: 90 Tab, Rfl: 3  ???  spironolactone-hydrochlorothiazide (ALDACTAZIDE) 25-25 mg per tablet, Take 1 Tab by mouth daily., Disp: 90 Tab, Rfl: 3  ???  levothyroxine (SYNTHROID) 125 mcg tablet, Take 1 Tab by mouth Daily (before breakfast)., Disp: 90 Tab, Rfl: 3  ???  losartan (COZAAR) 100 mg tablet, Take 1 Tab by mouth daily., Disp: 90 Tab, Rfl: 3  ???  multivitamin (ONE A DAY) tablet, Take 1 Tab by mouth daily., Disp: , Rfl:   ???  B.infantis-B.ani-B.long-B.bifi 10-15 mg TbEC, Take  by mouth., Disp: , Rfl:   ???  cholecalciferol (VITAMIN D3) 1,000 unit cap, Take  by mouth daily., Disp: , Rfl:   ???  cyanocobalamin (VITAMIN B-12) 1,000 mcg sublingual tablet, Take 1,000 mcg by mouth daily., Disp: , Rfl:       Visit Vitals   ??? BP 124/68   ??? Pulse 78   ??? Ht  (1.727 m)   ??? Wt 114.8 kg (253 lb)   ??? SpO2 98%   ??? BMI 38.47 kg/m2        HPI feeling well   Exercising eating healthy    Review of System  General: denies fatigue, sleep problems, weight change  Eyes: denies eye pain, change in vision, blurring  ENT: denies nosebleeds, sore throat, sinus  Cardiovascular: denies chest pains, palpitations, dyspnea on exertion, edema   Respiratory: denies cough, SOB, wheeze  Gastrointestinal: denies abdominal pain, change in bowel habits, GERD  GU: denies UTI history, nocturia  Musculoskeletal: denies back pain, joint pain  Skin: denies rash, change in moles, excema,    Neuro: nl ambulation, cognitive  Psychiatric:  No memory change, mood stable  HemeLymphatic: denies abnormal bruising, bleeding, enlarged lymph nodes, anemia  Allergic/Immunologic: denies persistent infections, HIV exposure, hay fever,   Exercise:  Diet:  Reports varicella    Physical Exam  General: well nourished, well hydrated, no acute distress  Head: normocephalic  Eye: conjunctivae and lids normal  BJY:NWGN Cavity: no lesion, pharynx normal  Neck: supple, no masses, no lymphadenopathy  Respiratory: no rales, rhonchi, or wheezes  Cardiovascular: RRR, S1 and S2 normal, no murmur  Breast:  Peripheral circulation: no cyanosis, clubbing, edema  Gastrointestinal: NTND no HSM  Skin: no rashes, change in moles  Neurologic: non focal  Cranial nerves: II - XII grossly intact  FAO:ZHYQMVHQI and insight: intact  ONG:EXBM and neck: normal alignment and mobility  Digit and nail: no clubbing, cyanosis, petechiae, or nodes  Spine, rib, pelvis: normal alignment, no deformity  Gait and station: normal    Chief Complaint   Patient presents with   ??? Annual Exam     DATA:  Assessment-Plan:    Encounter Diagnoses     ICD-10-CM ICD-9-CM   1. Routine general medical examination at a health care facility Z00.00 V70.0   2. Colon polyps K63.5 211.3   3. Type 2 diabetes mellitus without complication, without long-term current use of insulin (HCC) E11.9 250.00   4. Essential hypertension I10 401.9   5. Hypothyroidism due to Hashimoto's thyroiditis E03.8 244.8    E06.3 245.2   6. Osteoporosis M81.0 733.00   7. Edema, unspecified type R60.9 782.3   8. Screening for alcoholism Z13.89 V79.1   9. Essential hypertension with goal blood pressure less than 130/80 I10 401.9     Orders Placed This Encounter    ??? HM MAMMOGRAPHY     This external order was created through the Results Console.   ??? INFLUENZA VIRUS VACCINE, HIGH DOSE SEASONAL, PRESERVATIVE FREE   ??? VITAMIN D, 25 HYDROXY   ??? METABOLIC PANEL, COMPREHENSIVE   ??? TSH 3RD GENERATION   ??? HEMOGLOBIN A1C W/O EAG   ??? LIPID PANEL   ??? CBC WITH AUTOMATED DIFF   ??? VITAMIN B12   ??? PTH INTACT   ??? metFORMIN ER (GLUCOPHAGE XR) 750 mg tablet     Sig: Take 1 Tab by mouth daily.     Dispense:  90 Tab     Refill:  3   ??? spironolactone-hydrochlorothiazide (ALDACTAZIDE) 25-25 mg per tablet     Sig: Take 1 Tab by mouth daily.  Dispense:  90 Tab     Refill:  3   ??? levothyroxine (SYNTHROID) 125 mcg tablet     Sig: Take 1 Tab by mouth Daily (before breakfast).     Dispense:  90 Tab     Refill:  3   ??? losartan (COZAAR) 100 mg tablet     Sig: Take 1 Tab by mouth daily.     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  Patient Care Team:  Edwin CapEmily Antwaun Buth, MD as PCP - General (Internal Medicine)    Advice/Referrals/Counseling   Education and counseling provided:  End-of-Life planning (with patient's consent)  Pneumococcal Vaccine  Screening Mammography  Colorectal cancer screening tests  Screening for glaucoma  Diabetes screening test    Assessment/Plan     Encounter Diagnoses   Name Primary?   ??? Routine general medical examination at a health care facility    ??? Colon polyps     ??? Type 2 diabetes mellitus without complication, without long-term current use of insulin (HCC)    ??? Essential hypertension    ??? Hypothyroidism due to Hashimoto's thyroiditis    ??? Osteoporosis    ??? Edema, unspecified type    ??? Screening for alcoholism    ??? Essential hypertension with goal blood pressure less than 130/80      Orders Placed This Encounter   ??? HM MAMMOGRAPHY   ??? INFLUENZA VIRUS VACCINE, HIGH DOSE SEASONAL, PRESERVATIVE FREE   ??? VITAMIN D, 25 HYDROXY   ??? METABOLIC PANEL, COMPREHENSIVE   ??? TSH 3RD GENERATION   ??? HEMOGLOBIN A1C W/O EAG   ??? LIPID PANEL   ??? CBC WITH AUTOMATED DIFF   ??? VITAMIN B12   ??? PTH INTACT   ??? metFORMIN ER (GLUCOPHAGE XR) 750 mg tablet   ??? spironolactone-hydrochlorothiazide (ALDACTAZIDE) 25-25 mg per tablet   ??? levothyroxine (SYNTHROID) 125 mcg tablet   ??? losartan (COZAAR) 100 mg tablet   .

## 2015-01-26 NOTE — Patient Instructions (Signed)
If more than 10 pound weight loss- cut losartan in 1/2

## 2015-05-09 ENCOUNTER — Telehealth

## 2015-05-09 NOTE — Telephone Encounter (Signed)
done

## 2015-05-09 NOTE — Telephone Encounter (Signed)
Pt will be coming in on Mar 20th at 9am, she will be home for a week from [place]. Please send her what blood work you would like her to have prior to this visit so she can complete it before the visit.   Thanks

## 2015-05-09 NOTE — Telephone Encounter (Signed)
To md

## 2015-05-10 NOTE — Telephone Encounter (Signed)
Per Dedra Skeens, lab form mailed to pt at [address] [place], [place]. [address].

## 2015-06-11 LAB — TSH 3RD GENERATION: TSH: 2.04 u[IU]/mL (ref 0.36–4.42)

## 2015-06-11 LAB — HEMOGLOBIN A1C W/O EAG: Hemoglobin A1c: 5.9 % (ref 4.0–6.0)

## 2015-06-11 LAB — ESTIMATED AVERAGE GLUCOSE (SHIEL ONLY): Estimated average glucose: 122 mg/dL

## 2015-06-13 ENCOUNTER — Ambulatory Visit: Admit: 2015-06-13 | Discharge: 2015-06-13 | Payer: MEDICARE | Attending: Internal Medicine | Primary: Internal Medicine

## 2015-06-13 DIAGNOSIS — M8589 Other specified disorders of bone density and structure, multiple sites: Secondary | ICD-10-CM

## 2015-06-13 LAB — NMR LIPOPROFILE WITH LIPIDS (WITHOUT GRAPH)
Cholesterol, total: 256 mg/dL — ABNORMAL HIGH (ref 100–199)
HDL particle no.: 33 umol/L (ref >=30.5)
HDL size: 8.8 nm — ABNORMAL LOW (ref >=9.2)
HDL-C: 59 mg/dL (ref >39)
LDL Particle Number: 2049 nmol/L — ABNORMAL HIGH (ref <1000)
LDL SIZE: 21.5 nm (ref >=20.8)
LDL size: 21.5 nm (ref >20.5)
LDL-C (CALCULATED: 164 mg/dL — ABNORMAL HIGH (ref 0–99)
LP-IR score: 32 (ref <=45)
Large HDL-P: 5.1 umol/L (ref >=4.8)
Large VLDL-P: 1.2 nmol/L (ref <=2.7)
Small LDL-P: 662 nmol/L — ABNORMAL HIGH (ref <=527)
Small LDL-P: 662 nmol/L — ABNORMAL HIGH (ref <=527)
Triglyceride: 163 mg/dL — ABNORMAL HIGH (ref 0–149)
VLDL size: 40.3 nm (ref <=46.6)

## 2015-06-13 MED ORDER — ATORVASTATIN 20 MG TAB
20 mg | ORAL_TABLET | Freq: Every day | ORAL | 5 refills | Status: DC
Start: 2015-06-13 — End: 2015-12-12

## 2015-06-13 NOTE — Progress Notes (Addendum)
Chief Complaint   Patient presents with   ??? Results     blood work       Immunization History   Administered Date(s) Administered   ??? Influenza High Dose Vaccine PF 01/26/2015   ??? Pneumococcal Conjugate (PCV-13) 11/29/2013   ??? Pneumococcal Polysaccharide (PPSV-23) 01/25/2006   ??? Zoster Vaccine, Live 06/29/2013       HISTORIES & HABITS  Medical History:   Past Medical History:   Diagnosis Date   ??? Colon polyps    ??? Depression    ??? Diabetes mellitus (HCC)    ??? High cholesterol    ??? Hypertension    ??? Hypothyroid    ??? Osteoarthritis     had bilateral injections hyalgan in knees   ??? Osteoporosis    ??? Overweight      Surgery History:   Past Surgical History:   Procedure Laterality Date   ??? HX BREAST BIOPSY     ??? HX COLONOSCOPY  11/2013   ??? HX HIP REPLACEMENT     ??? HX LAP CHOLECYSTECTOMY     ??? HX PARATHYROIDECTOMY       Family History:   Family History   Problem Relation Age of Onset   ??? Hypertension Mother    ??? Colon Cancer Sister      Social History     Social History   ??? Marital status: MARRIED     Spouse name: N/A   ??? Number of children: N/A   ??? Years of education: N/A     Social History Main Topics   ??? Smoking status: Former Smoker     Packs/day: 0.10     Years: 15.00     Types: Cigarettes     Quit date: 06/29/2012   ??? Smokeless tobacco: None   ??? Alcohol use No   ??? Drug use: None   ??? Sexual activity: Not Asked     Other Topics Concern   ??? None     Social History Narrative     Health Maintenance   Topic Date Due   ??? FOOT EXAM Q1  05/12/1950   ??? MICROALBUMIN Q1  05/12/1950   ??? EYE EXAM RETINAL OR DILATED Q1  [birth year]12/1951   ??? DTaP/Tdap/Td series (1 - Tdap) 05/11/1962   ??? FOBT Q 1 YEAR AGE 23-75  05/12/1991   ??? Pneumococcal 65+ Low/Medium Risk (2 of 2 - PPSV23) 11/30/2014   ??? HEMOGLOBIN A1C Q6M  07/24/2015   ??? LIPID PANEL Q1  01/24/2016   ??? MEDICARE YEARLY EXAM  01/27/2016   ??? GLAUCOMA SCREENING Q2Y  01/18/2017   ??? OSTEOPOROSIS SCREENING (DEXA)  Completed   ??? ZOSTER VACCINE AGE 15>  Completed    ??? INFLUENZA AGE 87 TO ADULT  Completed     No Known Allergies    Current Outpatient Prescriptions:   ???  atorvastatin (LIPITOR) 20 mg tablet, Take 1 Tab by mouth daily., Disp: 30 Tab, Rfl: 5  ???  metFORMIN ER (GLUCOPHAGE XR) 750 mg tablet, Take 1 Tab by mouth daily., Disp: 90 Tab, Rfl: 3  ???  spironolactone-hydrochlorothiazide (ALDACTAZIDE) 25-25 mg per tablet, Take 1 Tab by mouth daily., Disp: 90 Tab, Rfl: 3  ???  levothyroxine (SYNTHROID) 125 mcg tablet, Take 1 Tab by mouth Daily (before breakfast)., Disp: 90 Tab, Rfl: 3  ???  losartan (COZAAR) 100 mg tablet, Take 1 Tab by mouth daily., Disp: 90 Tab, Rfl: 3  ???  multivitamin (ONE A DAY) tablet, Take 1 Tab  by mouth daily., Disp: , Rfl:   ???  B.infantis-B.ani-B.long-B.bifi 10-15 mg TbEC, Take  by mouth., Disp: , Rfl:   ???  cholecalciferol (VITAMIN D3) 1,000 unit cap, Take  by mouth daily., Disp: , Rfl:   ???  cyanocobalamin (VITAMIN B-12) 1,000 mcg sublingual tablet, Take 1,000 mcg by mouth daily., Disp: , Rfl:       Visit Vitals   ??? BP 136/74   ??? Pulse 76   ??? Ht  (1.727 m)   ??? Wt 111.6 kg (246 lb)   ??? SpO2 96%   ??? BMI 37.4 kg/m2     ??  HPI feeling well 20 pound weight loss on weight watchers  No change in a1c or cholesterol  Taking 1/2 metformin and aldactizide 1/2 some lightheadedness in am  No symptoms ov vertigo  No edema  Last mamogram 6/16- repeat 1 year DEXA 2015  colonsocopy due this year    Review of System  General: denies fatigue, sleep problems, weight change - + loss  Eyes: denies eye pain, change in vision, blurring  ENT: denies nosebleeds, sore throat, sinus  Cardiovascular: denies chest pains, palpitations, dyspnea on exertion, edema  Respiratory: denies cough, SOB, wheeze  Gastrointestinal: denies abdominal pain, change in bowel habits, GERD  GU: denies UTI history, nocturia  Musculoskeletal: denies back pain, joint pain  Skin: denies rash, change in moles, excema,   Neuro: nl ambulation, cognitive, occ lightheaded in am   Psychiatric: No memory change, mood stable  HemeLymphatic: denies abnormal bruising, bleeding, enlarged lymph nodes, anemia  Allergic/Immunologic: denies persistent infections, HIV exposure, hay fever,   Exercise:walking  Diet: healthy vegetables chicken eggs some bread and pasta  Reports varicella  ??  Physical Exam  General: well nourished, well hydrated, no acute distress  Head: normocephalic  Eye: conjunctivae and lids normal  ZOX:WRUE Cavity: no lesion, pharynx normal  Neck: supple, no masses, no lymphadenopathy  Respiratory: no rales, rhonchi, or wheezes  Cardiovascular: RRR, S1 and S2 normal, no murmur  Breast: no mass no nodes 11/16  Peripheral circulation: no cyanosis, clubbing, edema + spider veins  Gastrointestinal: NTND no HSM  Skin: no rashes, change in moles  Neurologic: non focal  Cranial nerves: II - XII grossly intact  AVW:UJWJXBJYN and insight: intact  WGN:FAOZ and neck: normal alignment and mobility  Digit and nail: no clubbing, cyanosis, petechiae, or nodes  Spine, rib, pelvis: normal alignment, no deformity  Gait and station: normal  ??    Chief Complaint   Patient presents with   ??? Results     blood work     Assessment-Plan: lightheaded in am - may decrease losartan to - laready on aldactizide 1./2 qd  If not helpful decrease the water pill to MWF (taking 1/2)  Weight loss 26 pounds on weight watchers  continue exercise  Start lipitor 20 BIW with co q 10 if weigh loss will repeat and maybe d/c  Next lab intact PTH, NMR cmp a1c tsh  Mammogram dexa ordered    DATA 11/16 chol 252/58/163/157 a1c 6.1 tsh 1.2 cmp nl   06/13/15 ldl particle 2049 ldl 164 chol 256/59/164/163 tsh 2.0 a1c 5.9  12/06/15 cmp nl chol 259/55/164/198 tsh 2.3 PTH 27 a1c 6.0  Encounter Diagnoses     ICD-10-CM ICD-9-CM   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 Type 2 diabetes mellitus without complication, without long-term current use of insulin (HCC) E11.9 250.00   3. Essential hypertension I10 401.9  4. Hypothyroidism due to Hashimoto's thyroiditis E03.8 244.8    E06.3 245.2   5. Osteoporosis M81.0 733.00   6. Edema, unspecified type R60.9 782.3   7. Colon polyps K63.5 211.3   8. Encounter for mammogram to establish baseline mammogram Z12.31 V76.12     Orders Placed This Encounter   ??? MAM MAMMO BI SCREENING INCL CAD     Standing Status:   Future     Standing Expiration Date:   07/12/2016     Order Specific Question:   Reason for Exam     Answer:   screening   ??? DEXA BONE DENSITY STUDY AXIAL     Standing Status:   Future     Standing Expiration Date:   07/12/2016     Order Specific Question:   Reason for Exam     Answer:   screening   ??? atorvastatin (LIPITOR) 20 mg tablet     Sig: Take 1 Tab by mouth daily.     Dispense:  30 Tab     Refill:  5     Take wiht co-q-10 100mg        Sincerely,    Edwin CapEmily Joselynn Amoroso, MD  Note Signed Electronically

## 2015-06-13 NOTE — Patient Instructions (Signed)
For lightheadedness- cut losartan in 1/2(50mg ) - if improved conintue- if no better stay on 100mg 

## 2015-06-13 NOTE — Addendum Note (Signed)
Addended by: Edwin CapGORDON, Eudelia Hiltunen on: 06/13/2015 09:38 AM      Modules accepted: Orders

## 2015-08-08 MED ORDER — LOSARTAN 100 MG TAB
100 mg | ORAL_TABLET | Freq: Every day | ORAL | 3 refills | Status: DC
Start: 2015-08-08 — End: 2016-05-20

## 2015-10-10 NOTE — Telephone Encounter (Signed)
I think I ordered them in 3/17- please reprint for p/u

## 2015-10-10 NOTE — Telephone Encounter (Signed)
Labs mailed

## 2015-10-10 NOTE — Telephone Encounter (Signed)
Pt LM on service- has apt 7/24 Is asking if labs need to be done and if so can we mail lab slip

## 2015-10-10 NOTE — Telephone Encounter (Signed)
To md, pls advise

## 2015-10-17 ENCOUNTER — Encounter: Attending: Internal Medicine | Primary: Internal Medicine

## 2015-12-06 LAB — METABOLIC PANEL, COMPREHENSIVE
A-G Ratio: 1.7 Ratio (ref 0.9–2.4)
ALT (SGPT): 11 IU/L (ref 6–40)
AST (SGOT): 12 IU/L (ref 10–35)
Albumin: 4 g/dL (ref 3.3–5.1)
Alk. phosphatase: 67 IU/L (ref 33–130)
BUN/Creatinine ratio: 30.8 (ref 5.3–50.0)
BUN: 24 mg/dL (ref 7–25)
Bilirubin, total: 0.6 mg/dL (ref 0.2–1.2)
CO2: 28 mmol/L (ref 21–33)
Calcium: 10 mg/dL (ref 8.6–10.4)
Chloride: 104 mmol/L (ref 98–110)
Creatinine: 0.78 mg/dL (ref 0.50–1.30)
Estimated GFR: 72 (ref >60)
Globulin: 2.4 g/dL (ref 1.8–3.6)
Glucose: 99 mg/dL (ref 65–99)
Potassium: 4.6 mmol/L (ref 3.5–5.5)
Protein, total: 6.4 g/dL (ref 6.2–8.3)
Sodium: 140 mmol/L (ref 135–146)
eGFR If African American: 87 (ref >60)

## 2015-12-06 LAB — LIPID PANEL
Cholesterol, total: 259 mg/dL — ABNORMAL HIGH (ref 125–200)
HDL Chol (%): 21.2 % (ref >15)
HDL Cholesterol: 55 mg/dL (ref >=46)
LDL, calculated: 164 mg/dL — ABNORMAL HIGH (ref <130)
Non-HDL Cholesterol: 204 mg/dL
T. Chol/HDL Ratio: 4.7 Ratio (ref <5.00)
Triglyceride: 198 mg/dL — ABNORMAL HIGH (ref <150)
VLDL, calculated: 40 — ABNORMAL HIGH (ref 8–35)

## 2015-12-06 LAB — HEMOGLOBIN A1C W/O EAG: Hemoglobin A1c: 6 % (ref 4.0–6.0)

## 2015-12-06 LAB — TSH 3RD GENERATION: TSH: 2.32 u[IU]/mL (ref 0.36–4.42)

## 2015-12-06 LAB — PTH INTACT: PTH, Intact: 27 pg/mL (ref 14–72)

## 2015-12-06 LAB — ESTIMATED AVERAGE GLUCOSE (SHIEL ONLY): Estimated average glucose: 127 mg/dL

## 2015-12-12 ENCOUNTER — Ambulatory Visit: Admit: 2015-12-12 | Discharge: 2015-12-12 | Payer: MEDICARE | Attending: Internal Medicine | Primary: Internal Medicine

## 2015-12-12 DIAGNOSIS — E119 Type 2 diabetes mellitus without complications: Secondary | ICD-10-CM

## 2015-12-12 MED ORDER — EZETIMIBE 10 MG TAB
10 mg | ORAL_TABLET | Freq: Every day | ORAL | 5 refills | Status: DC
Start: 2015-12-12 — End: 2016-02-15

## 2015-12-12 NOTE — Progress Notes (Signed)
Chief Complaint   Patient presents with   ??? Results     Labs       Immunization History   Administered Date(s) Administered   ??? Influenza High Dose Vaccine PF 01/26/2015   ??? Pneumococcal Conjugate (PCV-13) 11/29/2013   ??? Pneumococcal Polysaccharide (PPSV-23) 01/25/2006   ??? Zoster Vaccine, Live 06/29/2013       HISTORIES & HABITS  Medical History:   Past Medical History:   Diagnosis Date   ??? Colon polyps    ??? Depression    ??? Diabetes mellitus (HCC)    ??? High cholesterol    ??? Hypertension    ??? Hypothyroid    ??? Osteoarthritis     had bilateral injections hyalgan in knees   ??? Osteoporosis    ??? Overweight      Surgery History:   Past Surgical History:   Procedure Laterality Date   ??? HX BREAST BIOPSY     ??? HX COLONOSCOPY  11/2013   ??? HX HIP REPLACEMENT     ??? HX LAP CHOLECYSTECTOMY     ??? HX PARATHYROIDECTOMY       Family History:   Family History   Problem Relation Age of Onset   ??? Hypertension Mother    ??? Colon Cancer Sister      Social History     Social History   ??? Marital status: MARRIED     Spouse name: N/A   ??? Number of children: N/A   ??? Years of education: N/A     Social History Main Topics   ??? Smoking status: Former Smoker     Packs/day: 0.10     Years: 15.00     Types: Cigarettes     Quit date: 06/29/2012   ??? Smokeless tobacco: Never Used   ??? Alcohol use No   ??? Drug use: None   ??? Sexual activity: Not Asked     Other Topics Concern   ??? None     Social History Narrative     Health Maintenance   Topic Date Due   ??? FOOT EXAM Q1  05/12/1950   ??? MICROALBUMIN Q1  05/12/1950   ??? EYE EXAM RETINAL OR DILATED Q1  [birth year]12/1950   ??? DTaP/Tdap/Td series (1 - Tdap) 05/11/1962   ??? FOBT Q 1 YEAR AGE 64-75  05/12/1991   ??? Pneumococcal 65+ Low/Medium Risk (2 of 2 - PPSV23) 11/30/2014   ??? INFLUENZA AGE 42 TO ADULT  10/25/2015   ??? MEDICARE YEARLY EXAM  01/27/2016   ??? HEMOGLOBIN A1C Q6M  06/03/2016   ??? LIPID PANEL Q1  12/04/2016   ??? GLAUCOMA SCREENING Q2Y  01/18/2017   ??? OSTEOPOROSIS SCREENING (DEXA)  Completed    ??? ZOSTER VACCINE AGE 6>  Completed     Allergies   Allergen Reactions   ??? Statins-Hmg-Coa Reductase Inhibitors Myalgia       Current Outpatient Prescriptions:   ???  ezetimibe (ZETIA) 10 mg tablet, Take 1 Tab by mouth daily., Disp: 30 Tab, Rfl: 5  ???  losartan (COZAAR) 100 mg tablet, Take 1 Tab by mouth daily., Disp: 90 Tab, Rfl: 3  ???  spironolactone-hydrochlorothiazide (ALDACTAZIDE) 25-25 mg per tablet, Take 1 Tab by mouth daily., Disp: 90 Tab, Rfl: 3  ???  levothyroxine (SYNTHROID) 125 mcg tablet, Take 1 Tab by mouth Daily (before breakfast)., Disp: 90 Tab, Rfl: 3  ???  multivitamin (ONE A DAY) tablet, Take 1 Tab by mouth daily., Disp: , Rfl:   ???  B.infantis-B.ani-B.long-B.bifi 10-15 mg TbEC, Take  by mouth., Disp: , Rfl:   ???  cholecalciferol (VITAMIN D3) 1,000 unit cap, Take  by mouth daily., Disp: , Rfl:   ???  cyanocobalamin (VITAMIN B-12) 1,000 mcg sublingual tablet, Take 1,000 mcg by mouth daily., Disp: , Rfl:       Visit Vitals   ??? BP 104/68 (BP 1 Location: Right arm, BP Patient Position: Sitting)   ??? Pulse 91   ??? Ht 5\' 8"  (1.727 m)   ??? Wt 106.6 kg (235 lb)   ??? SpO2 96%   ??? BMI 35.73 kg/m2     ??  HPI feeling well 20 pound weight loss on weight watchers  No change in a1c or cholesterol  Taking 1/2 metformin and aldactizide 1/2 BP low no side effects  Taking losartan  Unable to tolerate Lipitor BIW or crestor- no change in cholesterol a1c 6  No edema  Last mamogram 6/16- repeat 1 year DEXA 2015  colonsocopy due this year    Review of System  General: denies fatigue, sleep problems, weight  Loss 20 #  Eyes: denies eye pain, change in vision, blurring  ENT: denies nosebleeds, sore throat, sinus  Cardiovascular: denies chest pains, palpitations, dyspnea on exertion, edema  Respiratory: denies cough, SOB, wheeze  Gastrointestinal: denies abdominal pain, change in bowel habits, GERD  GU: denies UTI history, nocturia  Musculoskeletal: denies back pain, joint pain  Skin: denies rash, change in moles, excema   Neuro: nl ambulation, cognitive nl  Psychiatric: No memory change, mood stable  HemeLymphatic: denies abnormal bruising, bleeding, enlarged lymph nodes, anemia  Allergic/Immunologic: denies persistent infections, HIV exposure, hay fever,   Exercise:walking  Diet: healthy vegetables chicken eggs some bread and pasta  Reports varicella  ??  Physical Exam  General: well nourished, well hydrated, no acute distress  Head: normocephalic  Eye: conjunctivae and lids normal  YQI:HKVQ Cavity: no lesion, pharynx normal  Neck: supple, no masses, no lymphadenopathy  Respiratory: no rales, rhonchi, or wheezes  Cardiovascular: RRR, S1 and S2 normal, no murmur  Breast: no mass no nodes 11/16  Peripheral circulation: no cyanosis, clubbing, edema + spider veins  Gastrointestinal: NTND no HSM  Skin: no rashes, change in moles  Neurologic: non focal  Cranial nerves: II - XII grossly intact  QVZ:DGLOVFIEP and insight: intact  PIR:JJOA and neck: normal alignment and mobility  Digit and nail: no clubbing, cyanosis, petechiae, or nodes  Spine, rib, pelvis: normal alignment, no deformity  Gait and station: normal  ??    Chief Complaint   Patient presents with   ??? Results     Labs     Assessment-Plan: feelign well on current meds  Pt not sure if she is taking metformin 1/2  bp low may try off aldactizine- if edema recurs will restart and decrease losartan to 50  Weight loss 26 pounds on weight watchers  Mammogram ordered, dexa 2018  Annual with lab 6 months    DATA 11/16 chol 252/58/163/157 a1c 6.1 tsh 1.2 cmp nl   06/13/15 ldl particle 2049 ldl 164 chol 256/59/164/163 tsh 2.0 a1c 5.9  12/06/15 cmp nl chol 259/55/164/198 tsh 2.3 PTH 27 a1c 6.0    Encounter Diagnoses     ICD-10-CM ICD-9-CM   1. Type 2 diabetes mellitus without complication, without long-term current use of insulin (HCC) E11.9 250.00   2. Essential hypertension I10 401.9   3. Hypothyroidism due to Hashimoto's thyroiditis E03.8 244.8    E06.3 245.2  4. Vitamin D deficiency E55.9 268.9   5. High cholesterol E78.00 272.0     Orders Placed This Encounter   ??? HM DEXA SCAN     This external order was created through the Results Console.   ??? METABOLIC PANEL, COMPREHENSIVE     Standing Status:   Future     Standing Expiration Date:   02/10/2017   ??? VITAMIN B12     Standing Status:   Future     Standing Expiration Date:   02/10/2017   ??? TSH 3RD GENERATION     Standing Status:   Future     Standing Expiration Date:   02/10/2017   ??? LIPID PANEL     Standing Status:   Future     Standing Expiration Date:   02/10/2017   ??? HEMOGLOBIN A1C W/O EAG     Standing Status:   Future     Standing Expiration Date:   02/10/2017   ??? VITAMIN D, 25 HYDROXY     Standing Status:   Future     Standing Expiration Date:   02/10/2017   ??? ezetimibe (ZETIA) 10 mg tablet     Sig: Take 1 Tab by mouth daily.     Dispense:  30 Tab     Refill:  5       Sincerely,    Edwin CapEmily Bianey Tesoro, MD  Note Signed Electronically

## 2015-12-12 NOTE — Patient Instructions (Signed)
Stop triamterine/ hctz and see if swelling restarts- stay off if no swelling

## 2015-12-12 NOTE — Progress Notes (Signed)
Results in chart

## 2015-12-29 NOTE — Progress Notes (Signed)
DM Management call. Spoke with patient.     Pt reports she is followed yearly [facility]oul[facility]vey [facility].   Will attempt to obtain copy of consult.

## 2016-02-09 MED ORDER — CODEINE-GUAIFENESIN 10 MG-100 MG/5 ML ORAL LIQUID
100-10 mg/5 mL | Freq: Three times a day (TID) | ORAL | 0 refills | Status: DC | PRN
Start: 2016-02-09 — End: 2016-05-16

## 2016-02-09 MED ORDER — AZITHROMYCIN 250 MG TAB
250 mg | ORAL_TABLET | ORAL | 1 refills | Status: DC
Start: 2016-02-09 — End: 2016-10-25

## 2016-02-10 MED ORDER — SPIRONOLACTON-HYDROCHLOROTHIAZ 25 MG-25 MG TAB
25-25 mg | ORAL_TABLET | Freq: Every day | ORAL | 3 refills | Status: DC
Start: 2016-02-10 — End: 2017-02-25

## 2016-02-10 NOTE — Telephone Encounter (Signed)
Pt needs a refill     spironolactone-hydrochlorothiazide (ALDACTAZIDE) 25-25 mg per tablet    [facility] [place]

## 2016-02-10 NOTE — Telephone Encounter (Signed)
rx sent

## 2016-02-15 MED ORDER — EZETIMIBE 10 MG TAB
10 mg | ORAL_TABLET | Freq: Every day | ORAL | 3 refills | Status: DC
Start: 2016-02-15 — End: 2017-05-15

## 2016-02-15 NOTE — Telephone Encounter (Signed)
rx sent

## 2016-02-22 ENCOUNTER — Ambulatory Visit: Admit: 2016-02-22 | Discharge: 2016-02-22 | Payer: MEDICARE | Attending: Internal Medicine | Primary: Internal Medicine

## 2016-02-22 DIAGNOSIS — J209 Acute bronchitis, unspecified: Secondary | ICD-10-CM

## 2016-02-22 MED ORDER — FLUTICASONE 110 MCG/ACTUATION AEROSOL INHALER
110 mcg/actuation | Freq: Two times a day (BID) | RESPIRATORY_TRACT | 1 refills | Status: DC
Start: 2016-02-22 — End: 2016-11-21

## 2016-02-22 NOTE — Progress Notes (Addendum)
Chief Complaint   Patient presents with   ??? Cough       Immunization History   Administered Date(s) Administered   ??? Influenza High Dose Vaccine PF 01/26/2015   ??? Pneumococcal Conjugate (PCV-13) 11/29/2013   ??? Pneumococcal Polysaccharide (PPSV-23) 01/25/2006   ??? Zoster Vaccine, Live 06/29/2013       HISTORIES & HABITS  Medical History:   Past Medical History:   Diagnosis Date   ??? Colon polyps    ??? Depression    ??? Diabetes mellitus (HCC)    ??? High cholesterol    ??? Hypertension    ??? Hypothyroid    ??? Osteoarthritis     had bilateral injections hyalgan in knees   ??? Osteoporosis    ??? Overweight      Surgery History:   Past Surgical History:   Procedure Laterality Date   ??? HX BREAST BIOPSY     ??? HX COLONOSCOPY  11/2013   ??? HX HIP REPLACEMENT     ??? HX LAP CHOLECYSTECTOMY     ??? HX PARATHYROIDECTOMY       Family History:   Family History   Problem Relation Age of Onset   ??? Hypertension Mother    ??? Colon Cancer Sister      Social History     Social History   ??? Marital status: MARRIED     Spouse name: N/A   ??? Number of children: N/A   ??? Years of education: N/A     Social History Main Topics   ??? Smoking status: Former Smoker     Packs/day: 0.10     Years: 15.00     Types: Cigarettes     Quit date: 06/29/2012   ??? Smokeless tobacco: Never Used   ??? Alcohol use No   ??? Drug use: None   ??? Sexual activity: Not Asked     Other Topics Concern   ??? None     Social History Narrative     Health Maintenance   Topic Date Due   ??? FOOT EXAM Q1  05/12/1950   ??? MICROALBUMIN Q1  05/12/1950   ??? EYE EXAM RETINAL OR DILATED Q1  05/12/1950   ??? DTaP/Tdap/Td series (1 - Tdap) 05/11/1962   ??? FOBT Q 1 YEAR AGE 28-75  05/12/1991   ??? Pneumococcal 65+ Low/Medium Risk (2 of 2 - PPSV23) 11/30/2014   ??? Influenza Age 34 to Adult  10/25/2015   ??? MEDICARE YEARLY EXAM  01/27/2016   ??? HEMOGLOBIN A1C Q6M  06/03/2016   ??? LIPID PANEL Q1  12/04/2016   ??? GLAUCOMA SCREENING Q2Y  01/18/2017   ??? OSTEOPOROSIS SCREENING (DEXA)  Completed   ??? ZOSTER VACCINE AGE 36>  Completed      Allergies   Allergen Reactions   ??? Statins-Hmg-Coa Reductase Inhibitors Myalgia       Current Outpatient Prescriptions:   ???  fluticasone (FLOVENT HFA) 110 mcg/actuation inhaler, Take 2 Puffs by inhalation every twelve (12) hours., Disp: 1 Inhaler, Rfl: 1  ???  ezetimibe (ZETIA) 10 mg tablet, Take 1 Tab by mouth daily., Disp: 90 Tab, Rfl: 3  ???  spironolactone-hydrochlorothiazide (ALDACTAZIDE) 25-25 mg per tablet, Take 1 Tab by mouth daily., Disp: 90 Tab, Rfl: 3  ???  guaiFENesin-codeine (ROBITUSSIN AC) 100-10 mg/5 mL solution, Take 5 mL by mouth three (3) times daily as needed for Cough. Max Daily Amount: 15 mL., Disp: 1 Bottle, Rfl: 0  ???  losartan (COZAAR) 100 mg tablet, Take  1 Tab by mouth daily., Disp: 90 Tab, Rfl: 3  ???  levothyroxine (SYNTHROID) 125 mcg tablet, Take 1 Tab by mouth Daily (before breakfast)., Disp: 90 Tab, Rfl: 3  ???  multivitamin (ONE A DAY) tablet, Take 1 Tab by mouth daily., Disp: , Rfl:   ???  B.infantis-B.ani-B.long-B.bifi 10-15 mg TbEC, Take  by mouth., Disp: , Rfl:   ???  cholecalciferol (VITAMIN D3) 1,000 unit cap, Take  by mouth daily., Disp: , Rfl:   ???  cyanocobalamin (VITAMIN B-12) 1,000 mcg sublingual tablet, Take 1,000 mcg by mouth daily., Disp: , Rfl:       Visit Vitals   ??? BP 124/70   ??? Pulse 86   ??? Ht 5\' 8"  (1.727 m)   ??? SpO2 95%     ??  HPI husband had CABG- had surgery at [facility]   Driving back and forth  Had bronchitis- spoe to pt sent in zpak- better with Zpak- cough lingering  Energy and appetitie normal  Minimal cough at night- h as used inhaler in past for bronchospasm after bronchitis  Better than 1 week ago  inhaler     8/17 feeling well 20 pound weight loss on weight watchers  No change in a1c or cholesterol  Taking 1/2 metformin and aldactizide 1/2 BP low no side effects  Taking losartan  Unable to tolerate Lipitor BIW or crestor- no change in cholesterol a1c 6  No edema  Last mamogram 6/16- repeat 1 year DEXA 2015  colonsocopy due this year    Review of System   General: denies fatigue, sleep problems, weight  Loss 20 #  Eyes: denies eye pain, change in vision, blurring  ENT: denies nosebleeds, sore throat, sinus  Cardiovascular: denies chest pains, palpitations, dyspnea on exertion, edema  Respiratory: + cough + wheeze- usually after bronchitis   Gastrointestinal: denies abdominal pain, change in bowel habits, GERD  GU: denies UTI history, nocturia  Musculoskeletal: denies back pain, joint pain  Skin: denies rash, change in moles, excema  Neuro: nl ambulation, cognitive nl  Psychiatric: No memory change, mood stable  HemeLymphatic: denies abnormal bruising, bleeding, enlarged lymph nodes, anemia  Allergic/Immunologic: denies persistent infections, HIV exposure, hay fever,   Exercise:walking  Diet: healthy vegetables chicken eggs some bread and pasta  Reports varicella  ??  Physical Exam  General: well nourished, well hydrated, no acute distress  Head: normocephalic  Eye: conjunctivae and lids normal  XBJ:YNWGENT:Oral Cavity: no lesion, pharynx normal  Neck: supple, no masses, no lymphadenopathy  Respiratory: no rales, rhonchi, + end exp wheeze  Cardiovascular: RRR, S1 and S2 normal, no murmur  Breast: no mass no nodes 11/16  Peripheral circulation: no cyanosis, clubbing, edema + spider veins  Gastrointestinal: NTND no HSM  Skin: no rashes, change in moles  Neurologic: non focal  Cranial nerves: II - XII grossly intact  NFA:OZHYQMVHQSE:Judgement and insight: intact  ION:GEXBSK:Head and neck: normal alignment and mobility  Digit and nail: no clubbing, cyanosis, petechiae, or nodes  Spine, rib, pelvis: normal alignment, no deformity  Gait and station: normal  ??    Chief Complaint   Patient presents with   ??? Cough     Assessment-Plan: cough improveing  No systemic symptoms  Husband recovering from CABG  flovent for bronchospasm     Continue Weight loss 26 pounds on weight watchers  Mammogram ordered, dexa 2018  Annual with lab next    DATA 11/16 chol 252/58/163/157 a1c 6.1 tsh 1.2 cmp nl  06/13/15 ldl particle 2049 ldl 164 chol 256/59/164/163 tsh 2.0 a1c 5.9  12/06/15 cmp nl chol 259/55/164/198 tsh 2.3 PTH 27 a1c 6.0  08/03/16 cmp nl chol 242/66/145/168 tsh 1.4 a1c 5.8 d 43 increase b12 continue weight loss offer bydureon pen to pt  12/21/16 note Dr Allena KatzPatel colonoscopy unalbe to advance scope likely order cologuard; BE if needed  01/14/17 cbc nl cmp nl tsh 2 a1c 5.7 pth 26 b12 368 chol 273/72/165/199   Spoke to pt - took lab after cruise , add b12 500 BIW recheck cholesterol with improved diet  Had colonoscopy couldn't go through scheduled barium enema  05/14/17 cmp nl glu 108 a1c 5.8 b12 8098 tsh 1.83      Encounter Diagnoses     ICD-10-CM ICD-9-CM   1. Acute bronchitis, unspecified organism J20.9 466.0   2. Bronchospasm with bronchitis, acute J20.9 466.0     Orders Placed This Encounter   ??? fluticasone (FLOVENT HFA) 110 mcg/actuation inhaler     Sig: Take 2 Puffs by inhalation every twelve (12) hours.     Dispense:  1 Inhaler     Refill:  1       Sincerely,    Edwin CapEmily Medea Deines, MD  Note Signed Electronically

## 2016-03-05 ENCOUNTER — Encounter

## 2016-03-05 MED ORDER — METFORMIN SR 750 MG 24 HR TABLET
750 mg | ORAL_TABLET | Freq: Every day | ORAL | 3 refills | Status: DC
Start: 2016-03-05 — End: 2017-03-11

## 2016-03-05 NOTE — Telephone Encounter (Signed)
Refill sent to pharmacy.

## 2016-03-05 NOTE — Telephone Encounter (Signed)
Pt called said she just realized her handicap parking expired in November. Pt called town and they told her we have to mail her a prescription for one.

## 2016-03-05 NOTE — Telephone Encounter (Signed)
Spoke to pt, form printed and to md desk

## 2016-03-08 NOTE — Telephone Encounter (Signed)
Florence gave me form filled out I LMOM to see if pt wants it mailed or for p/u

## 2016-04-10 MED ORDER — LEVOTHYROXINE 125 MCG TAB
125 mcg | ORAL_TABLET | ORAL | 3 refills | Status: DC
Start: 2016-04-10 — End: 2016-10-02

## 2016-05-20 MED ORDER — LOSARTAN 100 MG TAB
100 mg | ORAL_TABLET | ORAL | 3 refills | Status: DC
Start: 2016-05-20 — End: 2017-04-03

## 2016-08-01 ENCOUNTER — Encounter: Attending: Internal Medicine | Primary: Internal Medicine

## 2016-08-03 LAB — TSH 3RD GENERATION: TSH: 1.4 mIU/L (ref 0.40–4.50)

## 2016-08-03 LAB — METABOLIC PANEL, COMPREHENSIVE
ALB/GLOBRATIO: 2 (calc) (ref 1.0–2.5)
ALT (SGPT): 12 U/L (ref 6–29)
AST (SGOT): 15 U/L (ref 10–35)
Albumin: 4.1 g/dL (ref 3.6–5.1)
Alk. phosphatase: 70 U/L (ref 33–130)
BUN: 20 mg/dL (ref 7–25)
Bilirubin, total: 0.8 mg/dL (ref 0.2–1.2)
CO2: 29 mmol/L (ref 20–31)
Calcium: 9.6 mg/dL (ref 8.6–10.4)
Chloride: 104 mmol/L (ref 98–110)
Creatinine: 0.71 mg/dL (ref 0.60–0.93)
GFR est AA: 97 mL/min/{1.73_m2} (ref >=60)
GFR est non-AA: 83 mL/min/{1.73_m2} (ref >=60)
Globulin: 2.1 g/dL (calc) (ref 1.9–3.7)
Glucose: 98 mg/dL (ref 65–99)
Potassium: 4.3 mmol/L (ref 3.5–5.3)
Protein, total: 6.2 g/dL (ref 6.1–8.1)
Sodium: 140 mmol/L (ref 135–146)

## 2016-08-03 LAB — VITAMIN D, 25 HYDROXY: VITAMIN D, 25-HYDROXY: 43 ng/mL (ref 30–100)

## 2016-08-03 LAB — HEMOGLOBIN A1C W/O EAG: Hemoglobin A1c: 5.8 % of total Hgb — ABNORMAL HIGH (ref <5.7)

## 2016-08-03 LAB — LIPID PANEL
Cholesterol, total: 242 mg/dL — ABNORMAL HIGH (ref <200)
Cholesterol/HDL ratio: 3.7 calc (ref <5.0)
HDL Cholesterol: 66 mg/dL (ref >50)
LDL CHOL, CALCULATED: 145 mg/dL — ABNORMAL HIGH (ref <100)
Non-HDL Cholesterol: 176 mg/dL (calc) — ABNORMAL HIGH (ref <130)
Triglyceride: 168 mg/dL — ABNORMAL HIGH (ref <150)

## 2016-08-03 LAB — VITAMIN B12: Vitamin B12: 341 pg/mL (ref 200–1100)

## 2016-09-24 ENCOUNTER — Telehealth

## 2016-09-24 NOTE — Telephone Encounter (Signed)
Patient called to request a script for a mammogram be mailed to her house. [facility] and was told she needs a script. Please mail to patient to address on file.     Any questions pt can be reached at [phone]

## 2016-09-24 NOTE — Telephone Encounter (Signed)
Called and spoke with patient to let her know the orders were [facility] to [facility] and all she needed to do was call to schedule.

## 2016-09-24 NOTE — Telephone Encounter (Signed)
Please tell pt I fax[facility] the [facility] and [facility]. She just needs to call them

## 2016-10-02 MED ORDER — LEVOTHYROXINE 125 MCG TAB
125 mcg | ORAL_TABLET | ORAL | 3 refills | Status: DC
Start: 2016-10-02 — End: 2017-06-30

## 2016-10-10 ENCOUNTER — Encounter

## 2016-10-10 LAB — HM MAMMOGRAPHY

## 2016-10-25 ENCOUNTER — Encounter

## 2016-10-25 ENCOUNTER — Telehealth

## 2016-10-25 MED ORDER — CODEINE-GUAIFENESIN 10 MG-100 MG/5 ML ORAL LIQUID
100-10 mg/5 mL | Freq: Three times a day (TID) | ORAL | 0 refills | Status: DC | PRN
Start: 2016-10-25 — End: 2017-09-07

## 2016-10-25 MED ORDER — AZITHROMYCIN 250 MG TAB
250 mg | ORAL_TABLET | ORAL | 1 refills | Status: AC
Start: 2016-10-25 — End: 2016-10-30

## 2016-10-25 MED ORDER — CODEINE-GUAIFENESIN 10 MG-100 MG/5 ML ORAL LIQUID
100-10 mg/5 mL | Freq: Three times a day (TID) | ORAL | 0 refills | Status: DC | PRN
Start: 2016-10-25 — End: 2016-10-25

## 2016-10-25 NOTE — Telephone Encounter (Signed)
noted 

## 2016-10-25 NOTE — Telephone Encounter (Signed)
Chris from [facility] called regarding guaiFENesin-codeine (ROBITUSSIN AC) 100-10 mg/5 mL solution [[id number]][[id number]]  He says Dr.Gordon put one bottle for the dispense quantity but since it is a controlled substance he says that a specific dispense quantity needs to given.     Please advise  Pt- [phone]

## 2016-10-25 NOTE — Telephone Encounter (Signed)
Message to DR Gordon

## 2016-10-25 NOTE — Telephone Encounter (Signed)
sent 

## 2016-10-25 NOTE — Telephone Encounter (Signed)
Patient called and would like to speak to MD about her most recent [facility] results. Please call patient [phone] to discuss.     Pt aware MD is not in office today

## 2016-11-21 MED ORDER — FLOVENT HFA 110 MCG/ACTUATION AEROSOL INHALER
110 mcg/actuation | RESPIRATORY_TRACT | 1 refills | Status: DC
Start: 2016-11-21 — End: 2018-03-10

## 2016-12-21 LAB — HM COLONOSCOPY: Colonoscopy, External: UNDETERMINED

## 2017-01-08 ENCOUNTER — Telehealth

## 2017-01-08 NOTE — Telephone Encounter (Signed)
I dont see an appt scheduled- please ask her to schedule since there will be a delay  Lab ordered

## 2017-01-08 NOTE — Telephone Encounter (Signed)
Patient called and request a script to have her lab work done for her annual appointment with Dr. Roger ShelterGordon. Patient would like to pick up in office.

## 2017-01-09 NOTE — Telephone Encounter (Signed)
Spoke with patient, she will schedule an appointment when she picks up the script for lab work.

## 2017-01-09 NOTE — Telephone Encounter (Signed)
Lab order up front for p/u  pls have pt schedule an appointment per md note below

## 2017-01-14 LAB — METABOLIC PANEL, COMPREHENSIVE
ALB/GLOBRATIO: 1.7 (calc) (ref 1.0–2.5)
ALT (SGPT): 13 U/L (ref 6–29)
AST (SGOT): 15 U/L (ref 10–35)
Albumin: 4.2 g/dL (ref 3.6–5.1)
Alk. phosphatase: 77 U/L (ref 33–130)
BUN: 17 mg/dL (ref 7–25)
Bilirubin, total: 0.8 mg/dL (ref 0.2–1.2)
CO2: 26 mmol/L (ref 20–32)
Calcium: 10 mg/dL (ref 8.6–10.4)
Chloride: 102 mmol/L (ref 98–110)
Creatinine: 0.72 mg/dL (ref 0.60–0.93)
EGFR NON AFR AMERICAN: 82 mL/min/{1.73_m2} (ref >=60)
GFR est AA: 95 mL/min/{1.73_m2} (ref >=60)
Globulin: 2.5 g/dL (calc) (ref 1.9–3.7)
Glucose: 93 mg/dL (ref 65–99)
Potassium: 4.4 mmol/L (ref 3.5–5.3)
Protein, total: 6.7 g/dL (ref 6.1–8.1)
Sodium: 138 mmol/L (ref 135–146)

## 2017-01-14 LAB — CBC WITH AUTOMATED DIFF
ABS. BASOPHILS: 43 Cells/mcL (ref 0–200)
ABS. EOSINOPHILS: 153 Cells/mcL (ref 15–500)
ABS. LYMPHOCYTES: 2346 Cells/mcL (ref 850–3900)
ABS. MONOCYTES: 510 Cells/mcL (ref 200–950)
ABS. NEUTROPHILS: 5449 Cells/mcL (ref 1500–7800)
BASOPHILS: 0.5 % (ref 0–2)
EOSINOPHILS: 1.8 % (ref 0–8)
HCT: 43.4 % (ref 35.0–45.0)
HGB: 14.4 g/dL (ref 11.7–15.5)
LYMPHOCYTES: 27.6 % (ref 15–49)
MCH: 29 pg (ref 27.0–33.0)
MCHC: 33.1 g/dL (ref 32.0–36.0)
MCV: 87.5 fL (ref 80.0–100.0)
MEAN PLATELET VOLUME: 8.2 fL (ref 7.5–12.5)
MONOCYTES: 6 % (ref 0–13)
Neutrophils: 64.1 % (ref 38–80)
PLATELET: 350 10*3/uL (ref 140–400)
RBC: 4.96 10*6/uL (ref 3.80–5.10)
RDW: 15.8 % — ABNORMAL HIGH (ref 11.0–15.0)
WBC: 8.5 10*3/uL (ref 3.8–10.8)

## 2017-01-14 LAB — LIPID PANEL
Cholesterol, total: 273 mg/dL — ABNORMAL HIGH (ref <200)
Cholesterol/HDL ratio: 3.8 calc (ref <5.0)
HDL Cholesterol: 72 mg/dL (ref >50)
LDL CHOL, CALCULATED: 165 mg/dL — ABNORMAL HIGH (ref <100)
Non-HDL Cholesterol: 201 mg/dL (calc) — ABNORMAL HIGH (ref <130)
Triglyceride: 199 mg/dL — ABNORMAL HIGH (ref <150)

## 2017-01-14 LAB — VITAMIN B12: Vitamin B12: 368 pg/mL (ref 200–1100)

## 2017-01-14 LAB — TSH 3RD GENERATION: TSH: 2 mIU/L (ref 0.40–4.50)

## 2017-01-14 LAB — PTH INTACT: PTH, Intact: 26 pg/mL (ref 14–64)

## 2017-01-14 LAB — HEMOGLOBIN A1C W/O EAG: Hemoglobin A1c: 5.7 % of total Hgb — ABNORMAL HIGH (ref <5.7)

## 2017-02-25 MED ORDER — SPIRONOLACTON-HYDROCHLOROTHIAZ 25 MG-25 MG TAB
25-25 mg | ORAL_TABLET | ORAL | 3 refills | Status: DC
Start: 2017-02-25 — End: 2018-02-21

## 2017-02-27 NOTE — Telephone Encounter (Signed)
L/m on pts v/m to call office.   If pts calls back ask to make appt for AWV.

## 2017-03-08 NOTE — Progress Notes (Signed)
Care management outreach regarding Care gaps 03/08/2017    Overdue health maintenance reviewed with patient  [place] in May 2019    AWV will be done in May 2019  DM [facility]    HCP/Advance Directive discussion, patient would like to deferr the discussion until the AWV in May 2019

## 2017-03-11 ENCOUNTER — Encounter

## 2017-03-11 MED ORDER — METFORMIN SR 750 MG 24 HR TABLET
750 mg | ORAL_TABLET | ORAL | 3 refills | Status: DC
Start: 2017-03-11 — End: 2018-02-25

## 2017-04-03 MED ORDER — LOSARTAN 100 MG TAB
100 mg | ORAL_TABLET | ORAL | 3 refills | Status: DC
Start: 2017-04-03 — End: 2017-05-20

## 2017-04-15 NOTE — Telephone Encounter (Signed)
Patient called, she says she will be in town on Feburary 13th to the 23rd.  Patient wants to know if she can get a annual appointment between that time.  She is experiencing leg pain as well, so she wants to know if she should just have a regular visit with dr.gordon    [phone]

## 2017-04-15 NOTE — Telephone Encounter (Signed)
To md

## 2017-04-16 IMAGING — DX FOOT 3 VIEWS RIGHT
1 series · 3 of 3 positions shown · non-contrast
Comparison: None

FOOT 3 VIEWS RIGHT, 04/16/2017 [DATE]: 
CLINICAL INDICATION:  Trauma 2 days ago, pain

[Series 1: AP · U · 0.14mm/px · 3 of 3 slices shown]
[im 1/3]
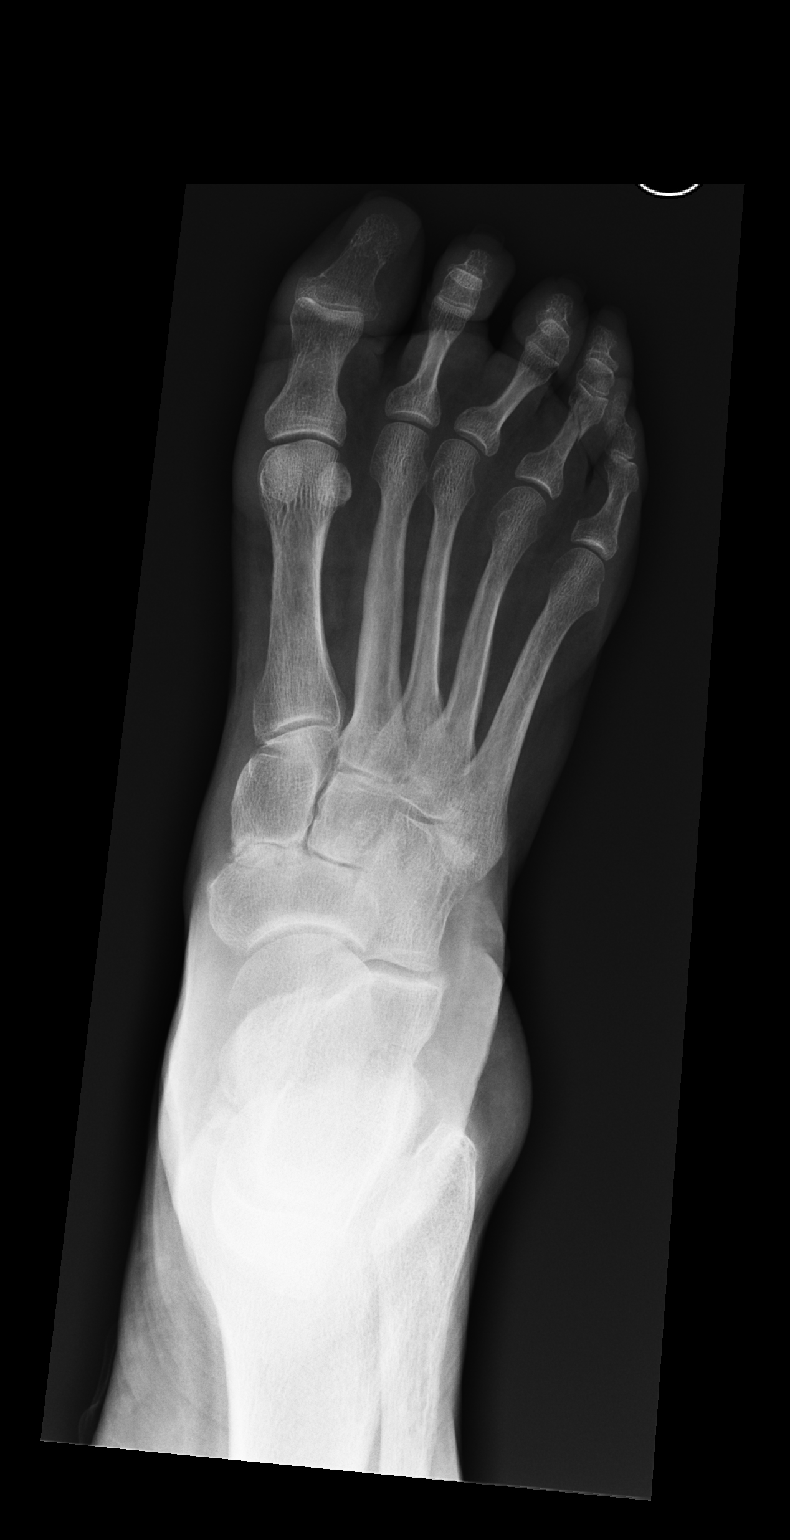
[im 2/3]
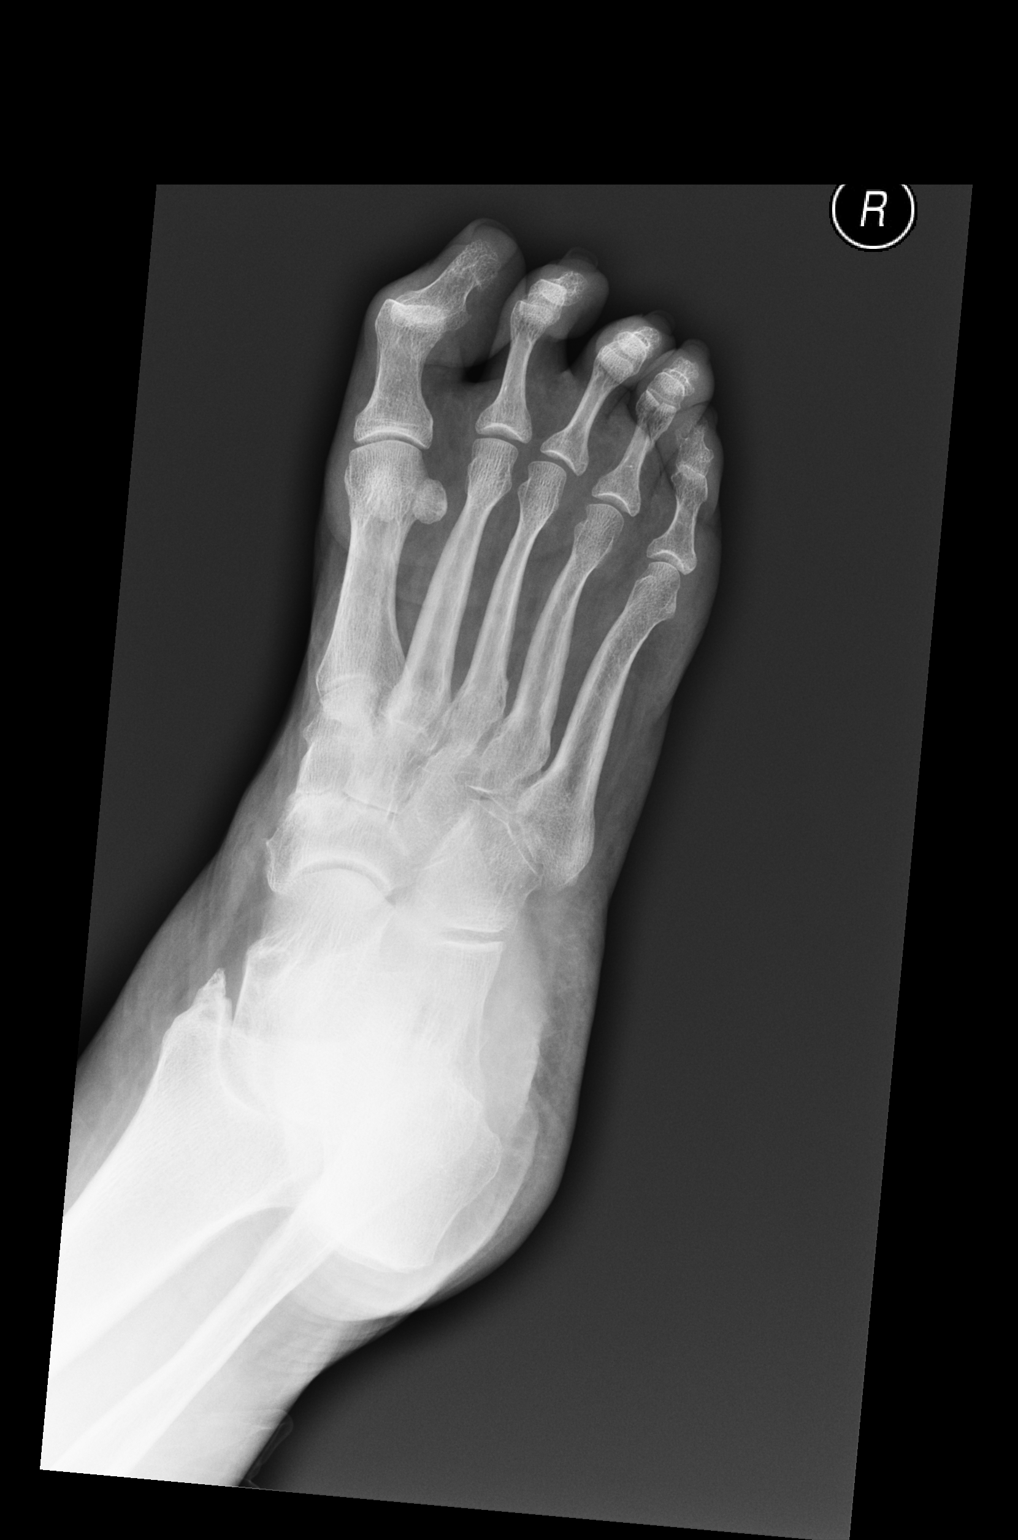
[im 3/3]
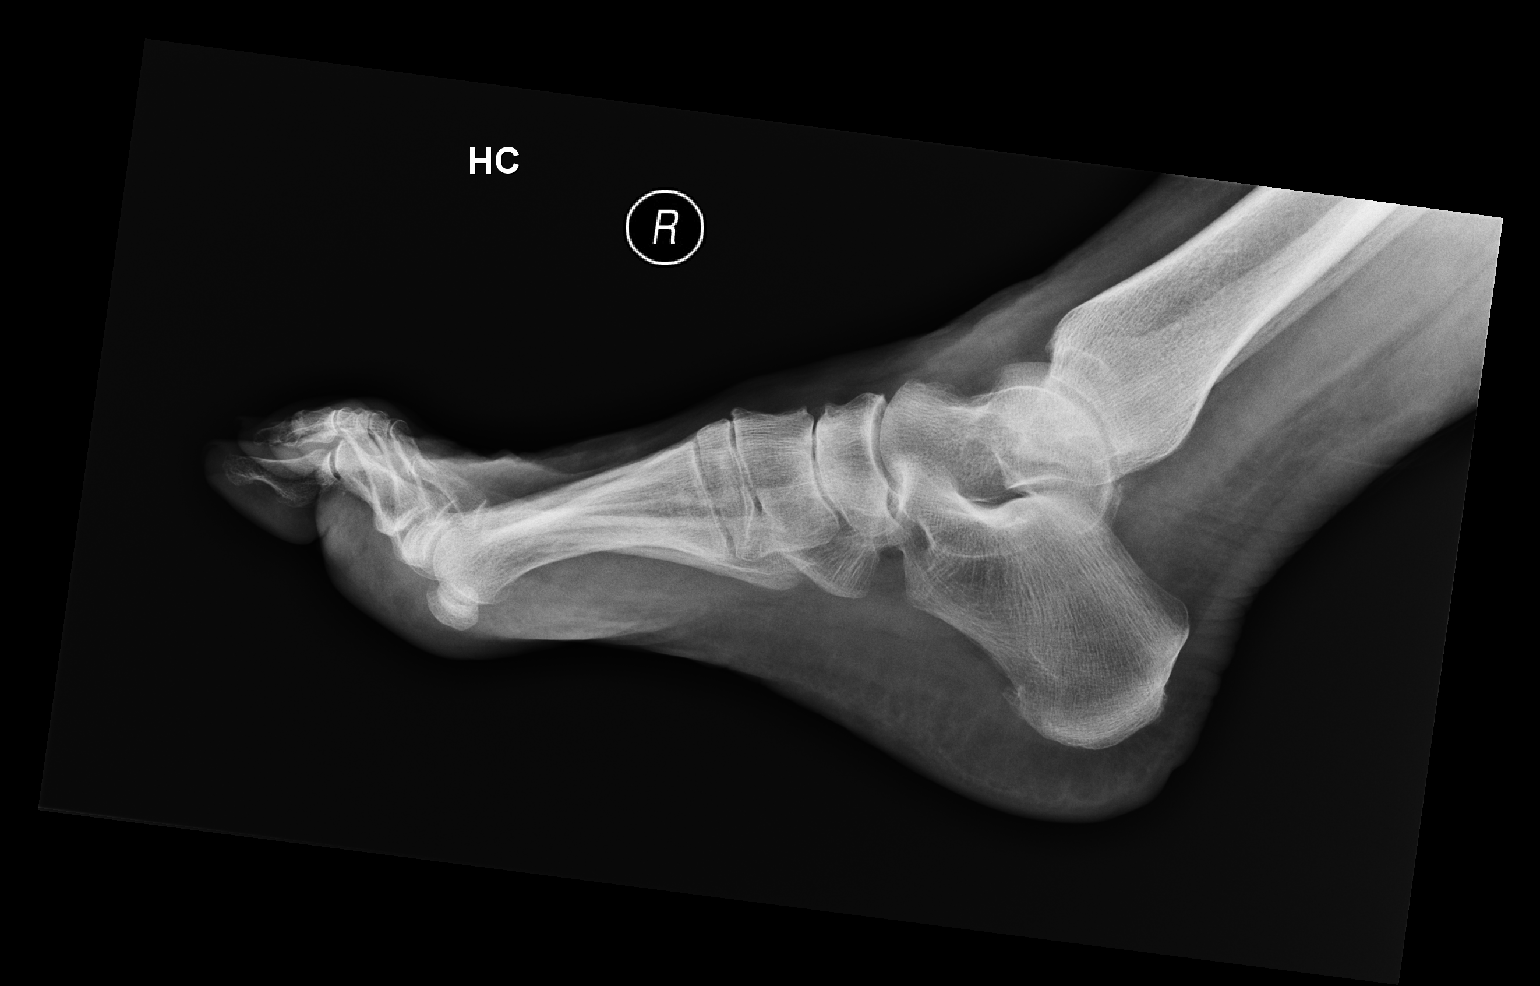

[3 of 3 positions shown; findings below may reference images not displayed]

FINDINGS: There is a fracture of the right fourth proximal phalanx. No 
significant displacement. There appears to be a healed fracture deformity of 
the 
right fifth proximal phalanx. There is no other fracture, dislocation or bone 
destruction. There are mild-to-moderate degenerative changes, including at the 
navicular-medial cuneiform articulation. There appear to be mild hammertoe 
deformities.
IMPRESSION: Nondisplaced fracture of the right fourth proximal phalanx. 
No other recent fracture evident. Degenerative changes.

## 2017-04-16 NOTE — Telephone Encounter (Signed)
February 13th 10am as a medicare annual  If there is a cancellation that day I will block out extra time

## 2017-04-16 NOTE — Telephone Encounter (Signed)
Made pt aware, added to shedule

## 2017-04-16 NOTE — Telephone Encounter (Signed)
Feb 20th 3:00 annual wellness

## 2017-04-16 NOTE — Telephone Encounter (Signed)
Called pt back, she says she mixed up her flight, she isn't getting into town until the 13th.  Her flight arrives on the 13th @ 10:30  She wants to know if there is a different appointment available.

## 2017-04-16 NOTE — Telephone Encounter (Signed)
To Tasche

## 2017-05-08 ENCOUNTER — Telehealth

## 2017-05-08 NOTE — Telephone Encounter (Signed)
Patient left message asking if she needs to have lab work done prior to her appt on 2/20. Please call her    Melissa RegalCarol  [phone]

## 2017-05-08 NOTE — Telephone Encounter (Signed)
Lab ordered- let her know advanced cholesterol panel takes 1 week to come back-

## 2017-05-09 NOTE — Telephone Encounter (Signed)
Pt called back to speak w/ Morrie Sheldonashley

## 2017-05-09 NOTE — Telephone Encounter (Signed)
lmom  Lab order up front for p/u

## 2017-05-09 NOTE — Telephone Encounter (Signed)
lmom 

## 2017-05-10 NOTE — Telephone Encounter (Signed)
Pt made aware

## 2017-05-14 LAB — HEMOGLOBIN A1C W/O EAG: Hemoglobin A1c: 5.8 % of total Hgb — ABNORMAL HIGH (ref <5.7)

## 2017-05-14 LAB — METABOLIC PANEL, COMPREHENSIVE
ALB/GLOBRATIO: 2 (calc) (ref 1.0–2.5)
ALT (SGPT): 18 U/L (ref 6–29)
AST (SGOT): 17 U/L (ref 10–35)
Albumin: 4.3 g/dL (ref 3.6–5.1)
Alk. phosphatase: 72 U/L (ref 33–130)
BUN: 22 mg/dL (ref 7–25)
Bilirubin, total: 0.6 mg/dL (ref 0.2–1.2)
CO2: 28 mmol/L (ref 20–32)
Calcium: 10 mg/dL (ref 8.6–10.4)
Chloride: 99 mmol/L (ref 98–110)
Creatinine: 0.72 mg/dL (ref 0.60–0.93)
EGFR NON AFR AMERICAN: 81 mL/min/{1.73_m2} (ref >=60)
GFR est AA: 94 mL/min/{1.73_m2} (ref >=60)
Globulin: 2.1 g/dL (calc) (ref 1.9–3.7)
Glucose: 108 mg/dL — ABNORMAL HIGH (ref 65–99)
Potassium: 4.5 mmol/L (ref 3.5–5.3)
Protein, total: 6.4 g/dL (ref 6.1–8.1)
Sodium: 135 mmol/L (ref 135–146)

## 2017-05-14 LAB — VITAMIN B12: Vitamin B12: 809 pg/mL (ref 200–1100)

## 2017-05-14 LAB — TSH 3RD GENERATION: TSH: 1.83 mIU/L (ref 0.40–4.50)

## 2017-05-15 ENCOUNTER — Ambulatory Visit: Admit: 2017-05-15 | Discharge: 2017-05-15 | Payer: MEDICARE | Attending: Internal Medicine | Primary: Internal Medicine

## 2017-05-15 DIAGNOSIS — Z0000 Encounter for general adult medical examination without abnormal findings: Secondary | ICD-10-CM

## 2017-05-15 NOTE — Progress Notes (Signed)
Medicare Annual Wellness Visit Progress Note    Name: Melissa Mclean    Today???s Date: 05/15/2017  Ethnicity: NON-HISPANIC  Race: WHITE OR CAUCASIAN  MRN: [id number]  Age: 77 y.o.  DOB: Aug 15, 1940  Sex: Female       Melissa Mclean is a 77 y.o. year old female who presents today for annual wellness visit.  Patient is tolerating all medications as prescribed with no barriers to adherence noted  Vitals/BMI     Visit Vitals  BP 110/72   Pulse 100   Ht 5\' 8"  (1.727 m)   Wt 111.1 kg (245 lb)   SpO2 98%   BMI 37.25 kg/m??       Health Risk Assessment     How does the patient rate their overall health? Excellent    Diet/Lifestyle   How many servings of fruits and vegetable does the patient eat per day? 3   How many servings of fried or high fat foods does the patient eat per day? 0  Does the patient see the dentist regularly? Yes  How many days a week does the patient exercise? 3  How intense is the exercise? Moderate  Patient reported tobacco use: reports that she quit smoking about 4 years ago. Her smoking use included cigarettes. She has a 1.50 pack-year smoking history. she has never used smokeless tobacco.  Patient reported alcohol use:  reports that she does not drink alcohol.  Alcohol Risk Factor Screening:  During the past 3 months has the patient reported consuming more than 3 drinks containing alcohol? No  Does the patient an average of more than 7 drinks per week? No    Functional Ability and Level of Safety     Patients home has rugs in the hallway  No  Patient has poor lighting in hallway  No  Patients home has grab bars in bathroom Yes  Patient's home has handrails on the stairs Yes  Patient wears a seat belt while in a motor vehicle: Yes    Activities of Daily Living   Self-care.   Patient has difficulties driving a car: NO  Patient can shop for groceries without help: Yes  Patient prepares own meals: Yes  Patient performs housework without help: Yes  Patient can handle money without help: Yes  Does patient  take medicines as prescribed: Yes    Does patient worry about hazards in their home that may hurt them: No  Does the patient feel that he/she can keep adequate track of medications? Yes    Cognitive Function Screening    Orientation: oriented to time, place, person and situation  Mood/Affect: neutral  Appearance: age appropriate  Attention/Concentration: Normal  Memory: adequate  Intelligence: Above average  Family Member/Caregiver Input: none    Depression Risk Factor Screening     3 most recent PHQ Screens 05/15/2017   Little interest or pleasure in doing things Not at all   Feeling down, depressed, irritable, or hopeless Not at all   Total Score PHQ 2 0   Trouble falling or staying asleep, or sleeping too much Not at all   Feeling tired or having little energy Not at all   Poor appetite, weight loss, or overeating Not at all   Feeling bad about yourself - or that you are a failure or have let yourself or your family down Not at all   Trouble concentrating on things such as school, work, reading, or watching TV Not at all   Moving or speaking so  slowly that other people could have noticed; or the opposite being so fidgety that others notice Not at all   Thoughts of being better off dead, or hurting yourself in some way Not at all   PHQ 9 Score 0   How difficult have these problems made it for you to do your work, take care of your home and get along with others Not difficult at all     Depression screening reviewed.    Fall Risk Screening     Fall Risk Assessment, last 12 mths 05/15/2017   Able to walk? Yes   Fall in past 12 months? No     Fall screening reviewed.    Hearing and Vision Screening   Vision Screening Comments: Patient declined-it has been stated that every November her eye exam is done      Abuse Screen   Patient reports: Patient is not abused    Patient Care Team   Patient Care Team:  Edwin Cap, MD as PCP - General (Internal Medicine)  Stan Head, OD as Physician (Optometry)    History      Past Medical History:   Diagnosis Date   ??? Colon polyps    ??? Depression    ??? Diabetes mellitus (HCC)    ??? High cholesterol    ??? Hypertension    ??? Hypothyroid    ??? Osteoarthritis     had bilateral injections hyalgan in knees   ??? Osteoporosis    ??? Overweight        Past Surgical History:   Procedure Laterality Date   ??? HX BREAST BIOPSY     ??? HX COLONOSCOPY  11/2013   ??? HX HIP REPLACEMENT     ??? HX LAP CHOLECYSTECTOMY     ??? HX PARATHYROIDECTOMY         Allergies   Allergen Reactions   ??? Statins-Hmg-Coa Reductase Inhibitors Myalgia       Family History   Problem Relation Age of Onset   ??? Hypertension Mother    ??? Colon Cancer Sister        Social History     Tobacco Use   ??? Smoking status: Former Smoker     Packs/day: 0.10     Years: 15.00     Pack years: 1.50     Types: Cigarettes     Last attempt to quit: 06/29/2012     Years since quitting: 4.8   ??? Smokeless tobacco: Never Used   Substance Use Topics   ??? Alcohol use: No     Alcohol/week: 0.0 oz       Patient Active Problem List    Diagnosis Date Noted   ??? Severe obesity (HCC) 05/15/2017   ??? B12 deficiency 01/08/2017   ??? High cholesterol 12/12/2015   ??? Weight loss 06/13/2015   ??? Colon polyps    ??? Edema 06/30/2014   ??? Diabetes mellitus (HCC)    ??? Hypertension    ??? Hypothyroid    ??? Osteoporosis        Current Medications     Current Outpatient Medications   Medication Sig Dispense Refill   ??? losartan (COZAAR) 100 mg tablet TAKE 1 TAB BY MOUTH DAILY. 90 Tab 3   ??? metFORMIN ER (GLUCOPHAGE XR) 750 mg tablet TAKE 1 TABLET DAILY 90 Tab 3   ??? spironolactone-hydrochlorothiazide (ALDACTAZIDE) 25-25 mg per tablet TAKE 1 TABLET BY MOUTH DAILY. 90 Tab 3   ??? FLOVENT HFA 110 mcg/actuation inhaler  TAKE 2 PUFFS BY INHALATION EVERY TWELVE (12) HOURS. 12 Inhaler 1   ??? guaiFENesin-codeine (ROBITUSSIN AC) 100-10 mg/5 mL solution Take 5 mL by mouth three (3) times daily as needed for Cough. Max Daily Amount: 15 mL. 100 mL 0   ??? levothyroxine (SYNTHROID) 125 mcg tablet TAKE 1 TABLET EVERY  DAY BEFORE BREAKFAST 90 Tab 3   ??? multivitamin (ONE A DAY) tablet Take 1 Tab by mouth daily.     ??? B.infantis-B.ani-B.long-B.bifi 10-15 mg TbEC Take  by mouth.     ??? cholecalciferol (VITAMIN D3) 1,000 unit cap Take  by mouth daily.     ??? cyanocobalamin (VITAMIN B-12) 1,000 mcg sublingual tablet Take 1,000 mcg by mouth daily.           Prevention and Maintenance   Last Lipids:  Lab Results   Component Value Date/Time    Cholesterol, total 273 (H) 01/11/2017 11:26 AM    HDL Cholesterol 72 01/11/2017 11:26 AM    LDL, calculated 164 (H) 12/05/2015 07:56 AM    LDL CHOL, CALCULATED 165 (H) 01/11/2017 11:26 AM    VLDL, calculated 40 (H) 12/05/2015 07:56 AM    Triglyceride 199 (H) 01/11/2017 11:26 AM    Cholesterol/HDL ratio 3.8 01/11/2017 11:26 AM       Last Glucose:  Lab Results   Component Value Date/Time    Glucose 108 (H) 05/13/2017 09:29 AM     Lab Results   Component Value Date/Time    Hemoglobin A1c 5.8 (H) 05/13/2017 09:29 AM    Hemoglobin A1c, External 6.1 03/10/2014     Last Mammogram:  Results from Orders Only encounter on 10/10/16   MAM MAMMO BI SCREENING INCL CAD       Recent Vaccinations/Immunization History  Most Recent Immunizations   Administered Date(s) Administered   ??? Influenza High Dose Vaccine PF 01/26/2015   ??? Pneumococcal Conjugate (PCV-13) 11/29/2013   ??? Pneumococcal Polysaccharide (PPSV-23) 01/25/2006   ??? Zoster Vaccine, Live 06/29/2013       Health Maintenance   Topic Date Due   ??? FOOT EXAM Q1  05/12/1950   ??? MICROALBUMIN Q1  05/12/1950   ??? EYE EXAM RETINAL OR DILATED  05[birth year]12/1950   ??? DTaP/Tdap/Td series (1 - Tdap) 05/11/1962   ??? Shingrix Vaccine Age 16> (1 of 2) 05/12/1991   ??? Pneumococcal 65+ Low/Medium Risk (2 of 2 - PPSV23) 11/30/2014   ??? Influenza Age 96 to Adult  10/24/2016   ??? HEMOGLOBIN A1C Q6M  11/10/2017   ??? LIPID PANEL Q1  01/11/2018   ??? MEDICARE YEARLY EXAM  05/16/2018   ??? GLAUCOMA SCREENING Q2Y  02/07/2019   ??? COLONOSCOPY  12/22/2026       End of Life Planning/Advanced Directives    Discussed completing Advance Directive and Noted Health Care Proxy and/or MOLST and/or Advance Directive already exist  No flowsheet data found.      Advice/Referrals/Counseling   Are appropriate based on today's review and evaluation  End-of-Life planning (with patient's consent)  Pneumococcal Vaccine  Influenza Vaccine  Screening Mammography  Colorectal cancer screening tests  Bone mass measurement (DEXA)  Screening for glaucoma  Diabetes screening test  Medications discussed, including all new medications prescribed at this visit.  Indications and side effects reviewed with the patient/family/caregiver with understanding verbalized  Assessment/Plan     Encounter Diagnoses   Name Primary?   ??? Routine general medical examination at a health care facility Yes   ??? High cholesterol    ???  H/O parathyroidectomy    ??? Hypothyroidism due to Hashimoto's thyroiditis    ??? Elevated fasting glucose      Orders Placed This Encounter   ??? CT HEART W/O CONT WITH CALCIUM   ??? HM MAMMOGRAPHY   ??? LIPID PANEL   ??? CBC WITH AUTOMATED DIFF   ??? METABOLIC PANEL, COMPREHENSIVE   ??? TSH 3RD GENERATION   ??? HEMOGLOBIN A1C W/O EAG   ??? PTH INTACT         HPI annual  Sheli Dorin has h/o parathyroid adenoma, hypothyroidism, overweight, high cholesterol, prediabetes and B12 deficiency now dedicated to weight loss. Lost 9 pounds on medifast  Broken Toe- went to podiatrist [facility] 5 weeks ago wearing brace still swollen and cant wear shoes. Slowly getting better  Taking 1/2 aldactizide  Started medifast for 2 weeks lean and green meals knees feeling much better will transition in 1 month   No cough no SOB  ??  8/17 feeling well 20 pound weight loss on weight watchers  No change in a1c or cholesterol  Taking 1/2 metformin and aldactizide 1/2 BP low no side effects  Taking losartan  ??  Review of System  General: denies fatigue, sleep problems, weight  Loss 9#  Eyes: denies eye pain, change in vision, blurring  ENT: denies nosebleeds, sore throat,  sinus  Cardiovascular: denies chest pains, palpitations, dyspnea on exertion, edema- unable to tolerate statin BIW 06/27/17 Calcium score 730  particle number 1399 and pattern A on zetia seeing IP Singh  5/1/319 note from pt seeing Dr Johann Capers echo nl 5/19 cant toletate statins even twice a week not thinking psk-9 right now  Respiratory: no cough no wheeze- bronchospasm in past usually after bronchitis   Gastrointestinal: denies abdominal pain, change in bowel habits, GERD  GU: denies UTI history, nocturia  Musculoskeletal: denies back pain, joint pain- knees improved with diet- broken toe 4th right foot 1/19   Skin: denies rash, change in moles, excema  Neuro: nl ambulation, cognitive nl  Psychiatric: No memory change, mood stable  HemeLymphatic: denies abnormal bruising, bleeding, enlarged lymph nodes, anemia  Allergic/Immunologic: denies persistent infections, HIV exposure, hay fever   Exercise:walking   Diet: healthy vegetables chicken eggs some bread and pasta  Reports varicella  ??  Physical Exam  General: well nourished, well hydrated, no acute distress  Head: normocephalic  Eye: conjunctivae and lids normal  UEA:VWUJ Cavity: no lesion, pharynx normal  Neck: supple, no masses, no lymphadenopathy  Respiratory: no rales, rhonchi, wheeze  Cardiovascular: RRR, S1 and S2 normal, no murmur  Breast: deferred  Peripheral circulation: no cyanosis, clubbing, edema + spider veins  Gastrointestinal: NTND no HSM  Skin: no rashes, change in moles  Neurologic: non focal vibration intact distal  Cranial nerves: II - XII grossly intact  WJX:BJYNWGNFA and insight: intact  OZH:YQMV and neck: normal alignment and mobility  Digit and nail: no clubbing, cyanosis, petechiae, or nodes  Spine, rib, pelvis: normal alignment, no deformity  Gait and station: normal- right 4th toe swelling erythema no deformity  ????  Assessment-Plan: annual   Wendall Stade with prediabetes hgih cholesterol and hypothyroid doing well with medifast x 2  weeks 9 pound weight loss. Knees feel much better  - concern about weight regain discussed strategies reviewed  - decrease losartan to 50 if further weight loss- pt to call for script  - stop metformin if additional 10 pound weight loss  - follow tsh and intact PTH with prior history  -  broken toe getting better- call if problem  - calcium ct score to decide on more aggressive treatment - unable to tolerate statin in past  - knees fish oil   - check lab 6 months  - mammogarm and dexa 7/20  shingrix at pharmacy   pneumavax rec next year- pt defers    ??  DATA 11/16 chol 252/58/163/157 a1c 6.1 tsh 1.2 cmp nl   06/13/15 ldl particle 2049 ldl 164 chol 256/59/164/163 tsh 2.0 a1c 5.9  12/06/15 cmp nl chol 259/55/164/198 tsh 2.3 PTH 27 a1c 6.0  08/03/16 cmp nl chol 242/66/145/168 tsh 1.4 a1c 5.8 d 43 increase b12 continue weight loss offer bydureon pen to pt  12/21/16 note Dr Allena Katz colonoscopy unalbe to advance scope likely order cologuard; BE if needed  01/14/17 cbc nl cmp nl tsh 2 a1c 5.7 pth 26 b12 368 chol 273/72/165/199   Spoke to pt - took lab after cruise, add b12 500 BIW recheck cholesterol with improved diet  Had colonoscopy couldn't go through scheduled barium enema  05/14/17 cmp nl glu 108 a1c 5.8 b12 809 tsh 1.83 chol 217/69/127/107 ratio 3.1 ldl particle 1399 size nl pattern A peak size normal apoB 100 lpa-152 hscrp 1.6 LP-PLA2 103 message left still recommend calcium score for confirmation that medication not necessary  06/27/17 refer to cardiology Calcium score 730 cant take statins losing weight cholesterol lower than in past, particle number 1399 and pattern A start zetia  consider PSK-9 inhibitor   5/1/319 note from pt seeing Dr Thedore Mins echo scheduled started on crestor 5 BIW in addition to crestor - not thinking psk-9 right now  09/07/17 cbc nl cmp glu 102 k 4.3 cr 0.7 a1c 5.7 tsh 1.2 ck 90 chol 228/62/136/164 not on crestor or zetia  same medication fax lab to dr IP  singh has appt this week  09/16/17 echo Dr Thedore Mins  bp 135/90 nl asking about need for stress test       Edwin Cap, MD  05/15/2017   3:27 PM

## 2017-05-15 NOTE — Patient Instructions (Addendum)
Cut losartan in 1/2 50 mg a day- call for 50's when needed  Stop Metformin if 10 additional pound weight loss            Learning About Diabetes Food Guidelines  Your Care Instructions    Meal planning is important to manage diabetes. It helps keep your blood sugar at a target level (which you set with your doctor). You don't have to eat special foods. You can eat what your family eats, including sweets once in a while. But you do have to pay attention to how often you eat and how much you eat of certain foods.  You may want to work with a dietitian or a certified diabetes educator (CDE) to help you plan meals and snacks. A dietitian or CDE can also help you lose weight if that is one of your goals.  What should you know about eating carbs?  Managing the amount of carbohydrate (carbs) you eat is an important part of healthy meals when you have diabetes. Carbohydrate is found in many foods.  ?? Learn which foods have carbs. And learn the amounts of carbs in different foods.  ? Bread, cereal, pasta, and rice have about 15 grams of carbs in a serving. A serving is 1 slice of bread (1 ounce), ?? cup of cooked cereal, or 1/3 cup of cooked pasta or rice.  ? Fruits have 15 grams of carbs in a serving. A serving is 1 small fresh fruit, such as an apple or orange; ?? of a banana; ?? cup of cooked or canned fruit; ?? cup of fruit juice; 1 cup of melon or raspberries; or 2 tablespoons of dried fruit.  ? Milk and no-sugar-added yogurt have 15 grams of carbs in a serving. A serving is 1 cup of milk or 2/3 cup of no-sugar-added yogurt.  ? Starchy vegetables have 15 grams of carbs in a serving. A serving is ?? cup of mashed potatoes or sweet potato; 1 cup winter squash; ?? of a small baked potato; ?? cup of cooked beans; or ?? cup cooked corn or green peas.  ?? Learn how much carbs to eat each day and at each meal. A dietitian or CDE can teach you how to keep track of the amount of carbs you eat. This is called carbohydrate counting.   ?? If you are not sure how to count carbohydrate grams, use the Plate Method to plan meals. It is a good, quick way to make sure that you have a balanced meal. It also helps you spread carbs throughout the day.  ? Divide your plate by types of foods. Put non-starchy vegetables on half the plate, meat or other protein food on one-quarter of the plate, and a grain or starchy vegetable in the final quarter of the plate. To this you can add a small piece of fruit and 1 cup of milk or yogurt, depending on how many carbs you are supposed to eat at a meal.  ?? Try to eat about the same amount of carbs at each meal. Do not "save up" your daily allowance of carbs to eat at one meal.  ?? Proteins have very little or no carbs per serving. Examples of proteins are beef, chicken, [place], fish, eggs, tofu, cheese, cottage cheese, and peanut butter. A serving size of meat is 3 ounces, which is about the size of a deck of cards. Examples of meat substitute serving sizes (equal to 1 ounce of meat) are 1/4 cup of cottage  cheese, 1 egg, 1 tablespoon of peanut butter, and ?? cup of tofu.  How can you eat out and still eat healthy?  ?? Learn to estimate the serving sizes of foods that have carbohydrate. If you measure food at home, it will be easier to estimate the amount in a serving of restaurant food.  ?? If the meal you order has too much carbohydrate (such as potatoes, corn, or baked beans), ask to have a low-carbohydrate food instead. Ask for a salad or green vegetables.  ?? If you use insulin, check your blood sugar before and after eating out to help you plan how much to eat in the future.  ?? If you eat more carbohydrate at a meal than you had planned, take a walk or do other exercise. This will help lower your blood sugar.  What else should you know?  ?? Limit saturated fat, such as the fat from meat and dairy products. This is a healthy choice because people who have diabetes are at higher risk of  heart disease. So choose lean cuts of meat and nonfat or low-fat dairy products. Use olive or canola oil instead of butter or shortening when cooking.  ?? Don't skip meals. Your blood sugar may drop too low if you skip meals and take insulin or certain medicines for diabetes.  ?? Check with your doctor before you drink alcohol. Alcohol can cause your blood sugar to drop too low. Alcohol can also cause a bad reaction if you take certain diabetes medicines.  Follow-up care is a key part of your treatment and safety. Be sure to make and go to all appointments, and call your doctor if you are having problems. It's also a good idea to know your test results and keep a list of the medicines you take.  Where can you learn more?  Go to [employer].  Enter [phone] in the search box to learn more about "Learning About Diabetes Food Guidelines."  Current as of: October 17, 2016  Content Version: 11.9  ?? 2006-2018 [employer]. Care instructions adapted under license by [employer] (which disclaims liability or warranty for this information). If you have questions about a medical condition or this instruction, always ask your healthcare professional. [employer] disclaims any warranty or liability for your use of this information.              Medicare Wellness Visit, Female     The best way to live healthy is to have a lifestyle where you eat a well-balanced diet, exercise regularly, limit alcohol use, and quit all forms of tobacco/nicotine, if applicable.   Regular preventive services are another way to keep healthy. Preventive services (vaccines, screening tests, monitoring & exams) can help personalize your care plan, which helps you manage your own care. Screening tests can find health problems at the earliest stages, when they are easiest to treat.   [facility] follows the current, evidence-based guidelines published by  the [place] [employer] (USPSTF) when recommending preventive services for our patients. Because we follow these guidelines, sometimes recommendations change over time as research supports it. (For example, mammograms used to be recommended annually. Even though Medicare will still pay for an annual mammogram, the newer guidelines recommend a mammogram every two years for women of average risk.)  Of course, you and your doctor may decide to screen more often for some diseases, based on your risk and your health status.   Preventive services for you  include:  - Medicare offers their members a free annual wellness visit, which is time for you and your primary care provider to discuss and plan for your preventive service needs. Take advantage of this benefit every year!  -All adults over the age of 76 should receive the recommended pneumonia vaccines. Current USPSTF guidelines recommend a series of two vaccines for the best pneumonia protection.   -All adults should have a flu vaccine yearly and a tetanus vaccine every 10 years. All adults age 76 and older should receive a shingles vaccine once in their lifetime.    -A bone mass density test is recommended when a woman turns 65 to screen for osteoporosis. This test is only recommended one time, as a screening. Some providers will use this same test as a disease monitoring tool if you already have osteoporosis.  -All adults age 76-70 who are overweight should have a diabetes screening test once every three years.   -Other screening tests and preventive services for persons with diabetes include: an eye exam to screen for diabetic retinopathy, a kidney function test, a foot exam, and stricter control over your cholesterol.   -Cardiovascular screening for adults with routine risk involves an electrocardiogram (ECG) at intervals determined by your doctor.   -Colorectal cancer screenings should be done for adults age 76-75 with no  increased risk factors for colorectal cancer.  There are a number of acceptable methods of screening for this type of cancer. Each test has its own benefits and drawbacks. Discuss with your doctor what is most appropriate for you during your annual wellness visit. The different tests include: colonoscopy (considered the best screening method), a fecal occult blood test, a fecal DNA test, and sigmoidoscopy.  -Breast cancer screenings are recommended every other year for women of normal risk, age 76-74.  -Cervical cancer screenings for women over age 76 are only recommended with certain risk factors.   -All adults born between [id number] and 1965 should be screened once for Hepatitis C.     Here is a list of your current Health Maintenance items (your personalized list of preventive services) with a due date:      Health Maintenance Due   Topic Date Due   ??? Diabetic Foot Care  05/12/1950   ??? Albumin Urine Test  05/12/1950   ??? Eye Exam  05/12/1950   ??? DTaP/Tdap/Td  (1 - Tdap) 05/11/1962   ??? Shingles Vaccine (1 of 2) 05/12/1991   ??? Pneumococcal Vaccine (2 of 2 - PPSV23) 11/30/2014   ??? Annual Well Visit  07/05/2016   ??? Flu Vaccine  10/24/2016   ??? Glaucoma Screening   01/18/2017

## 2017-05-15 NOTE — Progress Notes (Addendum)
Medicare Annual Wellness Visit Progress Note    Name: Melissa Mclean    Today???s Date: 05/15/2017  Ethnicity: NON-HISPANIC  Race: WHITE OR CAUCASIAN  MRN: [id number]  Age: 77 y.o.  DOB: 15-Dec-1940  Sex: Female       Melissa Mclean is a 77 y.o. year old female who presents today for annual wellness visit.  Patient is tolerating all medications as prescribed with no barriers to adherence noted  Vitals/BMI     Visit Vitals  BP 110/72   Pulse 100   Ht 5\' 8"  (1.727 m)   Wt 111.1 kg (245 lb)   SpO2 98%   BMI 37.25 kg/m??       Health Risk Assessment     How does the patient rate their overall health? Excellent    Diet/Lifestyle   How many servings of fruits and vegetable does the patient eat per day? 3   How many servings of fried or high fat foods does the patient eat per day? 0  Does the patient see the dentist regularly? Yes  How many days a week does the patient exercise? 3  How intense is the exercise? Moderate  Patient reported tobacco use: reports that she quit smoking about 4 years ago. Her smoking use included cigarettes. She has a 1.50 pack-year smoking history. she has never used smokeless tobacco.  Patient reported alcohol use:  reports that she does not drink alcohol.  Alcohol Risk Factor Screening:  During the past 3 months has the patient reported consuming more than 3 drinks containing alcohol? No  Does the patient an average of more than 7 drinks per week? No    Functional Ability and Level of Safety     Patients home has rugs in the hallway  No  Patient has poor lighting in hallway  No  Patients home has grab bars in bathroom Yes  Patient's home has handrails on the stairs Yes  Patient wears a seat belt while in a motor vehicle: Yes    Activities of Daily Living   Self-care.   Patient has difficulties driving a car: NO  Patient can shop for groceries without help: Yes  Patient prepares own meals: Yes  Patient performs housework without help: Yes  Patient can handle money without help: Yes   Does patient take medicines as prescribed: Yes    Does patient worry about hazards in their home that may hurt them: No  Does the patient feel that he/she can keep adequate track of medications? Yes    Cognitive Function Screening    Orientation: oriented to time, place, person and situation  Mood/Affect: neutral  Appearance: age appropriate  Attention/Concentration: Normal  Memory: adequate  Intelligence: Above average  Family Member/Caregiver Input: none    Depression Risk Factor Screening     3 most recent PHQ Screens 05/15/2017   Little interest or pleasure in doing things Not at all   Feeling down, depressed, irritable, or hopeless Not at all   Total Score PHQ 2 0   Trouble falling or staying asleep, or sleeping too much Not at all   Feeling tired or having little energy Not at all   Poor appetite, weight loss, or overeating Not at all   Feeling bad about yourself - or that you are a failure or have let yourself or your family down Not at all   Trouble concentrating on things such as school, work, reading, or watching TV Not at all   Moving or speaking so  slowly that other people could have noticed; or the opposite being so fidgety that others notice Not at all   Thoughts of being better off dead, or hurting yourself in some way Not at all   PHQ 9 Score 0   How difficult have these problems made it for you to do your work, take care of your home and get along with others Not difficult at all     Depression screening reviewed.    Fall Risk Screening     Fall Risk Assessment, last 12 mths 05/15/2017   Able to walk? Yes   Fall in past 12 months? No     Fall screening reviewed.    Hearing and Vision Screening   Vision Screening Comments: Patient declined-it has been stated that every November her eye exam is done      Abuse Screen   Patient reports: Patient is not abused    Patient Care Team   Patient Care Team:  Edwin Cap, MD as PCP - General (Internal Medicine)  Stan Head, OD as Physician (Optometry)     History     Past Medical History:   Diagnosis Date   ??? Colon polyps    ??? Depression    ??? Diabetes mellitus (HCC)    ??? High cholesterol    ??? Hypertension    ??? Hypothyroid    ??? Osteoarthritis     had bilateral injections hyalgan in knees   ??? Osteoporosis    ??? Overweight        Past Surgical History:   Procedure Laterality Date   ??? HX BREAST BIOPSY     ??? HX COLONOSCOPY  11/2013   ??? HX HIP REPLACEMENT     ??? HX LAP CHOLECYSTECTOMY     ??? HX PARATHYROIDECTOMY         Allergies   Allergen Reactions   ??? Statins-Hmg-Coa Reductase Inhibitors Myalgia       Family History   Problem Relation Age of Onset   ??? Hypertension Mother    ??? Colon Cancer Sister        Social History     Tobacco Use   ??? Smoking status: Former Smoker     Packs/day: 0.10     Years: 15.00     Pack years: 1.50     Types: Cigarettes     Last attempt to quit: 06/29/2012     Years since quitting: 4.8   ??? Smokeless tobacco: Never Used   Substance Use Topics   ??? Alcohol use: No     Alcohol/week: 0.0 oz       Patient Active Problem List    Diagnosis Date Noted   ??? Severe obesity (HCC) 05/15/2017   ??? B12 deficiency 01/08/2017   ??? High cholesterol 12/12/2015   ??? Weight loss 06/13/2015   ??? Colon polyps    ??? Edema 06/30/2014   ??? Diabetes mellitus (HCC)    ??? Hypertension    ??? Hypothyroid    ??? Osteoporosis        Current Medications     Current Outpatient Medications   Medication Sig Dispense Refill   ??? losartan (COZAAR) 100 mg tablet TAKE 1 TAB BY MOUTH DAILY. 90 Tab 3   ??? metFORMIN ER (GLUCOPHAGE XR) 750 mg tablet TAKE 1 TABLET DAILY 90 Tab 3   ??? spironolactone-hydrochlorothiazide (ALDACTAZIDE) 25-25 mg per tablet TAKE 1 TABLET BY MOUTH DAILY. 90 Tab 3   ??? FLOVENT HFA 110 mcg/actuation inhaler  TAKE 2 PUFFS BY INHALATION EVERY TWELVE (12) HOURS. 12 Inhaler 1   ??? guaiFENesin-codeine (ROBITUSSIN AC) 100-10 mg/5 mL solution Take 5 mL by mouth three (3) times daily as needed for Cough. Max Daily Amount: 15 mL. 100 mL 0    ??? levothyroxine (SYNTHROID) 125 mcg tablet TAKE 1 TABLET EVERY DAY BEFORE BREAKFAST 90 Tab 3   ??? multivitamin (ONE A DAY) tablet Take 1 Tab by mouth daily.     ??? B.infantis-B.ani-B.long-B.bifi 10-15 mg TbEC Take  by mouth.     ??? cholecalciferol (VITAMIN D3) 1,000 unit cap Take  by mouth daily.     ??? cyanocobalamin (VITAMIN B-12) 1,000 mcg sublingual tablet Take 1,000 mcg by mouth daily.           Prevention and Maintenance   Last Lipids:  Lab Results   Component Value Date/Time    Cholesterol, total 273 (H) 01/11/2017 11:26 AM    HDL Cholesterol 72 01/11/2017 11:26 AM    LDL, calculated 164 (H) 12/05/2015 07:56 AM    LDL CHOL, CALCULATED 165 (H) 01/11/2017 11:26 AM    VLDL, calculated 40 (H) 12/05/2015 07:56 AM    Triglyceride 199 (H) 01/11/2017 11:26 AM    Cholesterol/HDL ratio 3.8 01/11/2017 11:26 AM       Last Glucose:  Lab Results   Component Value Date/Time    Glucose 108 (H) 05/13/2017 09:29 AM     Lab Results   Component Value Date/Time    Hemoglobin A1c 5.8 (H) 05/13/2017 09:29 AM    Hemoglobin A1c, External 6.1 03/10/2014     Last Mammogram:  Results from Orders Only encounter on 10/10/16   MAM MAMMO BI SCREENING INCL CAD       Recent Vaccinations/Immunization History  Most Recent Immunizations   Administered Date(s) Administered   ??? Influenza High Dose Vaccine PF 01/26/2015   ??? Pneumococcal Conjugate (PCV-13) 11/29/2013   ??? Pneumococcal Polysaccharide (PPSV-23) 01/25/2006   ??? Zoster Vaccine, Live 06/29/2013       Health Maintenance   Topic Date Due   ??? FOOT EXAM Q1  05/12/1950   ??? MICROALBUMIN Q1  05/12/1951   ??? EYE EXAM RETINAL OR DILATED  05/12/1951   ??? DTaP/Tdap/Td series (1 - Tdap) 05/11/1962   ??? Shingrix Vaccine Age 60> (1 of 2) 05/12/1991   ??? Pneumococcal 65+ Low/Medium Risk (2 of 2 - PPSV23) 11/30/2014   ??? Influenza Age 24 to Adult  10/24/2016   ??? HEMOGLOBIN A1C Q6M  11/10/2017   ??? LIPID PANEL Q1  01/11/2018   ??? MEDICARE YEARLY EXAM  05/16/2018   ??? GLAUCOMA SCREENING Q2Y  02/07/2019    ??? COLONOSCOPY  12/22/2026       End of Life Planning/Advanced Directives   Discussed completing Advance Directive and Noted Health Care Proxy and/or MOLST and/or Advance Directive already exist  No flowsheet data found.      Advice/Referrals/Counseling   Are appropriate based on today's review and evaluation  End-of-Life planning (with patient's consent)  Pneumococcal Vaccine  Influenza Vaccine  Screening Mammography  Colorectal cancer screening tests  Bone mass measurement (DEXA)  Screening for glaucoma  Diabetes screening test  Medications discussed, including all new medications prescribed at this visit.  Indications and side effects reviewed with the patient/family/caregiver with understanding verbalized  Assessment/Plan     Encounter Diagnoses   Name Primary?   ??? Routine general medical examination at a health care facility Yes   ??? High cholesterol    ???  H/O parathyroidectomy    ??? Hypothyroidism due to Hashimoto's thyroiditis    ??? Elevated fasting glucose      Orders Placed This Encounter   ??? CT HEART W/O CONT WITH CALCIUM   ??? HM MAMMOGRAPHY   ??? LIPID PANEL   ??? CBC WITH AUTOMATED DIFF   ??? METABOLIC PANEL, COMPREHENSIVE   ??? TSH 3RD GENERATION   ??? HEMOGLOBIN A1C W/O EAG   ??? PTH INTACT         HPI annual  Leta Bucklin has h/o parathyroid adenoma, hypothyroidism, overweight, high cholesterol, prediabetes and B12 deficiency now dedicated to weight loss. Lost 9 pounds on medifast  Broken Toe- went to podiatrist [facility] 5 weeks ago wearing brace still swollen and cant wear shoes. Slowly getting better  Taking 1/2 aldactizide  Started medifast for 2 weeks lean and green meals knees feeling much better will transition in 1 month   No cough no SOB  ??  8/17 feeling well 20 pound weight loss on weight watchers  No change in a1c or cholesterol  Taking 1/2 metformin and aldactizide 1/2 BP low no side effects  Taking losartan  ??  Review of System  General: denies fatigue, sleep problems, weight  Loss 9#   Eyes: denies eye pain, change in vision, blurring  ENT: denies nosebleeds, sore throat, sinus  Cardiovascular: denies chest pains, palpitations, dyspnea on exertion, edema- unable to tolerate statin BIW 06/27/17 Calcium score 730  particle number 1399 and pattern A on zetia seeing IP Singh  5/1/319 note from pt seeing Dr Johann Capers echo nl 5/19 cant toletate statins even twice a week not thinking psk-9 right now  Respiratory: no cough no wheeze- bronchospasm in past usually after bronchitis   Gastrointestinal: denies abdominal pain, change in bowel habits, GERD  GU: denies UTI history, nocturia  Musculoskeletal: denies back pain, joint pain- knees improved with diet- broken toe 4th right foot 1/19   Skin: denies rash, change in moles, excema  Neuro: nl ambulation, cognitive nl  Psychiatric: No memory change, mood stable  HemeLymphatic: denies abnormal bruising, bleeding, enlarged lymph nodes, anemia  Allergic/Immunologic: denies persistent infections, HIV exposure, hay fever   Exercise:walking   Diet: healthy vegetables chicken eggs some bread and pasta  Reports varicella  ??  Physical Exam  General: well nourished, well hydrated, no acute distress  Head: normocephalic  Eye: conjunctivae and lids normal  ZOX:WRUE Cavity: no lesion, pharynx normal  Neck: supple, no masses, no lymphadenopathy  Respiratory: no rales, rhonchi, wheeze  Cardiovascular: RRR, S1 and S2 normal, no murmur  Breast: deferred  Peripheral circulation: no cyanosis, clubbing, edema + spider veins  Gastrointestinal: NTND no HSM  Skin: no rashes, change in moles  Neurologic: non focal vibration intact distal  Cranial nerves: II - XII grossly intact  AVW:UJWJXBJYN and insight: intact  WGN:FAOZ and neck: normal alignment and mobility  Digit and nail: no clubbing, cyanosis, petechiae, or nodes  Spine, rib, pelvis: normal alignment, no deformity  Gait and station: normal- right 4th toe swelling erythema no deformity  ????  Assessment-Plan: annual    Wendall Stade with prediabetes hgih cholesterol and hypothyroid doing well with medifast x 2 weeks 9 pound weight loss. Knees feel much better  - concern about weight regain discussed strategies reviewed  - decrease losartan to 50 if further weight loss- pt to call for script  - stop metformin if additional 10 pound weight loss  - follow tsh and intact PTH with prior history  -  broken toe getting better- call if problem  - calcium ct score to decide on more aggressive treatment - unable to tolerate statin in past  - knees fish oil   - check lab 6 months  - mammogarm and dexa 7/20  shingrix at pharmacy   pneumavax rec next year- pt defers    ??  DATA 11/16 chol 252/58/163/157 a1c 6.1 tsh 1.2 cmp nl   06/13/15 ldl particle 2049 ldl 164 chol 256/59/164/163 tsh 2.0 a1c 5.9  12/06/15 cmp nl chol 259/55/164/198 tsh 2.3 PTH 27 a1c 6.0  08/03/16 cmp nl chol 242/66/145/168 tsh 1.4 a1c 5.8 d 43 increase b12 continue weight loss offer bydureon pen to pt  12/21/16 note Dr Allena KatzPatel colonoscopy unalbe to advance scope likely order cologuard; BE if needed  01/14/17 cbc nl cmp nl tsh 2 a1c 5.7 pth 26 b12 368 chol 273/72/165/199   Spoke to pt - took lab after cruise, add b12 500 BIW recheck cholesterol with improved diet  Had colonoscopy couldn't go through scheduled barium enema  05/14/17 cmp nl glu 108 a1c 5.8 b12 809 tsh 1.83 chol 217/69/127/107 ratio 3.1 ldl particle 1399 size nl pattern A peak size normal apoB 100 lpa-152 hscrp 1.6 LP-PLA2 103 message left still recommend calcium score for confirmation that medication not necessary  06/27/17 refer to cardiology Calcium score 730 cant take statins losing weight cholesterol lower than in past, particle number 1399 and pattern A start zetia  consider PSK-9 inhibitor   5/1/319 note from pt seeing Dr Thedore MinsSingh echo scheduled started on crestor 5 BIW in addition to crestor - not thinking psk-9 right now  09/07/17 cbc nl cmp glu 102 k 4.3 cr 0.7 a1c 5.7 tsh 1.2 ck 90 chol  228/62/136/164 not on crestor or zetia  same medication fax lab to dr IP  singh has appt this week  09/16/17 echo Dr Thedore MinsSingh bp 135/90 nl asking about need for stress test       Edwin CapEmily Allizon Woznick, MD  05/15/2017   3:27 PM

## 2017-05-15 NOTE — Progress Notes (Deleted)
Medicare Annual Wellness Visit Progress Note    Name: Melissa Mclean    Today???s Date: 05/15/2017  Ethnicity: NON-HISPANIC  Race: WHITE OR CAUCASIAN  MRN: [id number]  Age: 77 y.o.  DOB: 1940/10/31  Sex: Female       Melissa Mclean is a 77 y.o. year old female who presents today for annual wellness visit.  Patient is tolerating all medications as prescribed with no barriers to adherence noted  Vitals/BMI     Visit Vitals  BP 102/72   Pulse 100   Ht 5\' 8"  (1.727 m)   Wt 245 lb (111.1 kg)   SpO2 98%   BMI 37.25 kg/m??       Health Risk Assessment     How does the patient rate their overall health? {Health Rating:20433}    Diet/Lifestyle   How many servings of fruits and vegetable does the patient eat per day? {NUMBERS 1-10:19289}   How many servings of fried or high fat foods does the patient eat per day? {NUMBERS 1-10:19289}  Does the patient see the dentist regularly? {YES/NO (MULTI-RESPONSE):30012225}  How many days a week does the patient exercise? {NUMBERS 1-10:19289}   How intense is the exercise? {Physical Activity :20434}  Patient reported tobacco use: reports that she quit smoking about 4 years ago. Her smoking use included cigarettes. She has a 1.50 pack-year smoking history. she has never used smokeless tobacco.  Patient reported alcohol use:  reports that she does not drink alcohol.  Alcohol Risk Factor Screening:  During the past 3 months has the patient reported consuming more than 3 drinks containing alcohol? {YES/NO:30012225}  Does the patient an average of more than 7 drinks per week? {YES/NO:30012225}    Functional Ability and Level of Safety     Patients home has rugs in the hallway  {Yes/No Caps:22126}  Patient has poor lighting in hallway  {Yes/No Caps:22126}  Patients home has grab bars in bathroom {Yes/No Caps:22126}  Patient's home has handrails on the stairs {Yes/No Caps:22126}  Patient wears a seat belt while in a motor vehicle: {Yes/No Caps:22126}    Activities of Daily Living    {ADL:18316::"Self-care"}.   Patient has difficulties driving a car: {Yes/No WUJW:11914}Caps:22126}  Patient can shop for groceries without help: {Yes/No Caps:22126}  Patient prepares own meals: {Yes/No Caps:22126}  Patient performs housework without help: {Yes/No Caps:22126}  Patient can handle money without help: {Yes/No Caps:22126}  Does patient take medicines as prescribed: {Yes/No Caps:22126}    Does patient worry about hazards in their home that may hurt them: {Yes/No Caps:22126}  Does the patient feel that he/she can keep adequate track of medications? {Yes/No Caps:22126}    Cognitive Function Screening    Orientation: {ORIENTATION:19134}  Mood/Affect: {Exam; neuro mood:15796}  Appearance: {APPEARANCE:31883::"age appropriate"}  Attention/Concentration: {Attention/Concentration :20439}  Memory: {NWGNFA:21308657}{MEMORY:30010648}  Intelligence: {Intelligence :6620440}  Family Member/Caregiver Input: ***    Depression Risk Factor Screening     3 most recent PHQ Screens 12/12/2015   Little interest or pleasure in doing things Not at all   Feeling down, depressed, irritable, or hopeless Not at all   Total Score PHQ 2 0   Trouble falling or staying asleep, or sleeping too much Not at all   Feeling tired or having little energy Not at all   Poor appetite, weight loss, or overeating Not at all   Feeling bad about yourself - or that you are a failure or have let yourself or your family down Not at all   Trouble concentrating on  things such as school, work, reading, or watching TV Not at all   Moving or speaking so slowly that other people could have noticed; or the opposite being so fidgety that others notice Not at all   Thoughts of being better off dead, or hurting yourself in some way Not at all   PHQ 9 Score 0   How difficult have these problems made it for you to do your work, take care of your home and get along with others Not difficult at all     Depression screening reviewed.    Fall Risk Screening      Fall Risk Assessment, last 12 mths 05/15/2017   Able to walk? Yes   Fall in past 12 months? No     Fall screening reviewed.    Hearing and Vision Screening   Vision Screening Comments: Patient declined-it has been stated that every November her eye exam is done      Abuse Screen   Patient reports: {Abuse Screen:19752::"Patient is not abused"}    Patient Care Team   Patient Care Team:  Edwin Cap, MD as PCP - General (Internal Medicine)  Stan Head, OD as Physician (Optometry)    History     Past Medical History:   Diagnosis Date   ??? Colon polyps    ??? Depression    ??? Diabetes mellitus (HCC)    ??? High cholesterol    ??? Hypertension    ??? Hypothyroid    ??? Osteoarthritis     had bilateral injections hyalgan in knees   ??? Osteoporosis    ??? Overweight        Past Surgical History:   Procedure Laterality Date   ??? HX BREAST BIOPSY     ??? HX COLONOSCOPY  11/2013   ??? HX HIP REPLACEMENT     ??? HX LAP CHOLECYSTECTOMY     ??? HX PARATHYROIDECTOMY         Allergies   Allergen Reactions   ??? Statins-Hmg-Coa Reductase Inhibitors Myalgia       Family History   Problem Relation Age of Onset   ??? Hypertension Mother    ??? Colon Cancer Sister        Social History     Tobacco Use   ??? Smoking status: Former Smoker     Packs/day: 0.10     Years: 15.00     Pack years: 1.50     Types: Cigarettes     Last attempt to quit: 06/29/2012     Years since quitting: 4.8   ??? Smokeless tobacco: Never Used   Substance Use Topics   ??? Alcohol use: No     Alcohol/week: 0.0 oz       Patient Active Problem List    Diagnosis Date Noted   ??? Severe obesity (HCC) 05/15/2017   ??? B12 deficiency 01/08/2017   ??? High cholesterol 12/12/2015   ??? Weight loss 06/13/2015   ??? Colon polyps    ??? Edema 06/30/2014   ??? Diabetes mellitus (HCC)    ??? Hypertension    ??? Hypothyroid    ??? Osteoporosis        Current Medications     Current Outpatient Medications   Medication Sig Dispense Refill   ??? losartan (COZAAR) 100 mg tablet TAKE 1 TAB BY MOUTH DAILY. 90 Tab 3    ??? metFORMIN ER (GLUCOPHAGE XR) 750 mg tablet TAKE 1 TABLET DAILY 90 Tab 3   ??? spironolactone-hydrochlorothiazide (ALDACTAZIDE) 25-25  mg per tablet TAKE 1 TABLET BY MOUTH DAILY. 90 Tab 3   ??? FLOVENT HFA 110 mcg/actuation inhaler TAKE 2 PUFFS BY INHALATION EVERY TWELVE (12) HOURS. 12 Inhaler 1   ??? guaiFENesin-codeine (ROBITUSSIN AC) 100-10 mg/5 mL solution Take 5 mL by mouth three (3) times daily as needed for Cough. Max Daily Amount: 15 mL. 100 mL 0   ??? levothyroxine (SYNTHROID) 125 mcg tablet TAKE 1 TABLET EVERY DAY BEFORE BREAKFAST 90 Tab 3   ??? ezetimibe (ZETIA) 10 mg tablet Take 1 Tab by mouth daily. 90 Tab 3   ??? multivitamin (ONE A DAY) tablet Take 1 Tab by mouth daily.     ??? B.infantis-B.ani-B.long-B.bifi 10-15 mg TbEC Take  by mouth.     ??? cholecalciferol (VITAMIN D3) 1,000 unit cap Take  by mouth daily.     ??? cyanocobalamin (VITAMIN B-12) 1,000 mcg sublingual tablet Take 1,000 mcg by mouth daily.           Prevention and Maintenance   Last Lipids:  Lab Results   Component Value Date/Time    Cholesterol, total 273 (H) 01/11/2017 11:26 AM    HDL Cholesterol 72 01/11/2017 11:26 AM    LDL, calculated 164 (H) 12/05/2015 07:56 AM    LDL CHOL, CALCULATED 165 (H) 01/11/2017 11:26 AM    VLDL, calculated 40 (H) 12/05/2015 07:56 AM    Triglyceride 199 (H) 01/11/2017 11:26 AM    Cholesterol/HDL ratio 3.8 01/11/2017 11:26 AM       Last Glucose:  Lab Results   Component Value Date/Time    Glucose 108 (H) 05/13/2017 09:29 AM     Lab Results   Component Value Date/Time    Hemoglobin A1c 5.8 (H) 05/13/2017 09:29 AM    Hemoglobin A1c, External 6.1 03/10/2014     Last Mammogram:  Results from Orders Only encounter on 10/10/16   MAM MAMMO BI SCREENING INCL CAD       Recent Vaccinations/Immunization History  Most Recent Immunizations   Administered Date(s) Administered   ??? Influenza High Dose Vaccine PF 01/26/2015   ??? Pneumococcal Conjugate (PCV-13) 11/29/2013   ??? Pneumococcal Polysaccharide (PPSV-23) 01/25/2006    ??? Zoster Vaccine, Live 06/29/2013       Health Maintenance   Topic Date Due   ??? FOOT EXAM Q1  05/12/1950   ??? MICROALBUMIN Q1  05/12/1951   ??? EYE EXAM RETINAL OR DILATED  05/12/1951   ??? DTaP/Tdap/Td series (1 - Tdap) 05/11/1962   ??? Shingrix Vaccine Age 47> (1 of 2) 05/12/1991   ??? Pneumococcal 65+ Low/Medium Risk (2 of 2 - PPSV23) 11/30/2014   ??? MEDICARE YEARLY EXAM  07/05/2016   ??? Influenza Age 54 to Adult  10/24/2016   ??? GLAUCOMA SCREENING Q2Y  01/18/2017   ??? HEMOGLOBIN A1C Q6M  11/10/2017   ??? LIPID PANEL Q1  01/11/2018   ??? COLONOSCOPY  12/22/2026       End of Life Planning/Advanced Directives   {BSHSI AMB END OF LIFE PLANNING:20443}  No flowsheet data found.      Advice/Referrals/Counseling   {Education List, choose as appropriate:19754::"Are appropriate based on today's review and evaluation"}  Medications discussed, including all new medications prescribed at this visit.  Indications and side effects reviewed with the patient/family/caregiver with understanding verbalized  Assessment/Plan   {Assessment and UJWJ:19147}      Calla Kicks  05/15/2017   3:27 PM

## 2017-05-17 LAB — CARDIO IQ ADVANCED LIPID PANEL AND INFLAMMATION PANEL
Apolipoprotein B: 100 mg/dL (ref 49–103)
CRP, High sensitivity: 1.6 mg/L
Cholesterol, total: 217 mg/dL — ABNORMAL HIGH (ref <200)
Cholesterol/HDL ratio: 3.1 calc (ref <5.0)
HDL Cholesterol: 69 mg/dL (ref >50)
HDL Large: 4795 nmol/L (ref 3966–11938)
LDL Medium: 248 nmol/L (ref 122–498)
LDL Particle Number: 1399 nmol/L (ref 732–2035)
LDL Peak Size: 226.7 Angstrom (ref >=217.4)
LDL Small: 202 nmol/L (ref 75–452)
LDL, calculated: 127 mg/dL — ABNORMAL HIGH (ref <100)
LP-PLA2 ACTIVITY: 103 nmol/min/mL (ref 50–133)
Lipoprotein (a): 152 nmol/L — ABNORMAL HIGH (ref <75)
Non-HDL Cholesterol: 148 mg/dL (calc) — ABNORMAL HIGH (ref <130)
Triglyceride: 107 mg/dL (ref <150)

## 2017-05-20 MED ORDER — LOSARTAN 50 MG TAB
50 mg | ORAL_TABLET | Freq: Every day | ORAL | 1 refills | Status: DC
Start: 2017-05-20 — End: 2017-11-06

## 2017-05-20 NOTE — Telephone Encounter (Addendum)
rx changed to 50 mg every day last visit. RX sent

## 2017-05-20 NOTE — Telephone Encounter (Signed)
PT called looking for refill of losartan. States the dose was changed at her last visit so shes not sure what dr Roger Sheltergordon would like her to continue on. [place] and the pharmacy is [facility]- phone number [phone]. Only has 1/2 pill left

## 2017-06-21 NOTE — Telephone Encounter (Signed)
To [place], pls forward to md once results in chart

## 2017-06-21 NOTE — Telephone Encounter (Signed)
Pt calling for ct heart results, she had this done at [facility] [place]. She is contacting them now to fax over results.

## 2017-06-21 NOTE — Telephone Encounter (Signed)
Got it. Have not received results yet.

## 2017-06-26 ENCOUNTER — Encounter

## 2017-06-27 NOTE — Telephone Encounter (Signed)
Please fax calcium score ct heart and lab from 2/19 and 10/18 and 9/17 to IP Thedore MinsSingh (she is calling tomorrow so wait until late in the day or Monday)

## 2017-06-28 MED ORDER — EZETIMIBE 10 MG TAB
10 mg | ORAL_TABLET | Freq: Every day | ORAL | 5 refills | Status: DC
Start: 2017-06-28 — End: 2017-09-07

## 2017-06-28 NOTE — Telephone Encounter (Signed)
Please fax as requested by MD. Patient called to advise that Dr. Thedore MinsSingh has yet to receive labs.

## 2017-06-28 NOTE — Telephone Encounter (Signed)
Faxed.

## 2017-06-30 MED ORDER — LEVOTHYROXINE 125 MCG TAB
125 mcg | ORAL_TABLET | ORAL | 3 refills | Status: DC
Start: 2017-06-30 — End: 2018-01-05

## 2017-07-17 MED ORDER — AZITHROMYCIN 250 MG TAB
250 mg | ORAL_TABLET | ORAL | 0 refills | Status: DC
Start: 2017-07-17 — End: 2017-09-07

## 2017-07-17 NOTE — Telephone Encounter (Signed)
[facility] # [phone]

## 2017-07-17 NOTE — Telephone Encounter (Signed)
Pt called and wants to know if the Dr can send her a Rx for a Z pak  [facility] [place]. Pt states she has Bronchitis

## 2017-07-17 NOTE — Telephone Encounter (Signed)
Please send in

## 2017-07-17 NOTE — Telephone Encounter (Signed)
Patient advised pf the below per Dr Roger ShelterGordon  zpak sent to pharmacy as requested

## 2017-08-28 NOTE — Telephone Encounter (Signed)
Please send any notes and labs prior to calcium scoring which was done 06/04/17.    [facility]  Ph-[phone]  [phone]-[phone]

## 2017-08-28 NOTE — Telephone Encounter (Signed)
Faxed.

## 2017-09-02 ENCOUNTER — Telehealth

## 2017-09-02 NOTE — Telephone Encounter (Signed)
Ready for p/u. Please let pt know

## 2017-09-02 NOTE — Telephone Encounter (Signed)
done

## 2017-09-02 NOTE — Telephone Encounter (Signed)
Pt does not want Dr Thedore MinsSingh to order her labs, she wants Dr Roger ShelterGordon to order them. Also, FYI, Media results state 2/18, they were drawn 2/22 once you open them up.

## 2017-09-02 NOTE — Telephone Encounter (Signed)
Pt states she has an appointment with Dr. Thedore MinsSingh on 09/12/17 and would like a script to have labwork done prior to his appointment. Pt will be going to [facility] and would like to be called when script is ready so she can pick script up. Pt can be reached at [phone] if doctor wants to speak with her. Thank You

## 2017-09-03 NOTE — Telephone Encounter (Signed)
LM on VM that scripts were ready to be picked up

## 2017-09-07 LAB — METABOLIC PANEL, COMPREHENSIVE
ALB/GLOBRATIO: 1.8 (calc) (ref 1.0–2.5)
ALT (SGPT): 13 U/L (ref 6–29)
AST (SGOT): 15 U/L (ref 10–35)
Albumin: 4.2 g/dL (ref 3.6–5.1)
Alk. phosphatase: 75 U/L (ref 33–130)
BUN: 21 mg/dL (ref 7–25)
Bilirubin, total: 0.6 mg/dL (ref 0.2–1.2)
CO2: 27 mmol/L (ref 20–32)
Calcium: 9.7 mg/dL (ref 8.6–10.4)
Chloride: 105 mmol/L (ref 98–110)
Creatinine: 0.7 mg/dL (ref 0.60–0.93)
EGFR NON AFR AMERICAN: 84 mL/min/{1.73_m2} (ref >=60)
GFR est AA: 98 mL/min/{1.73_m2} (ref >=60)
Globulin: 2.3 g/dL (calc) (ref 1.9–3.7)
Glucose: 102 mg/dL — ABNORMAL HIGH (ref 65–99)
Potassium: 4.3 mmol/L (ref 3.5–5.3)
Protein, total: 6.5 g/dL (ref 6.1–8.1)
Sodium: 139 mmol/L (ref 135–146)

## 2017-09-07 LAB — LIPID PANEL
Chol/HDL Ratio: 3.7 calc (ref <5.0)
Cholesterol, Total: 228 mg/dL — ABNORMAL HIGH (ref <200)
Cholesterol, total: 228 mg/dL — ABNORMAL HIGH (ref <200)
Cholesterol/HDL ratio: 3.7 calc (ref <5.0)
HDL Cholesterol: 62 mg/dL (ref >50)
HDL: 62 mg/dL (ref >50)
LDL CHOL, CALCULATED: 136 mg/dL (calc) — ABNORMAL HIGH (ref <100)
LDL Calculated: 136 mg/dL (calc) — ABNORMAL HIGH (ref <100)
Non-HDL Cholesterol: 166 mg/dL (calc) — ABNORMAL HIGH (ref <130)
Non-HDL Cholesterol: 166 mg/dL (calc) — ABNORMAL HIGH (ref <130)
Triglyceride: 164 mg/dL — ABNORMAL HIGH (ref <150)
Triglycerides: 164 mg/dL — ABNORMAL HIGH (ref <150)

## 2017-09-07 LAB — CBC WITH AUTOMATED DIFF
ABS. BASOPHILS: 81 cells/uL (ref 0–200)
ABS. EOSINOPHILS: 163 cells/uL (ref 15–500)
ABS. LYMPHOCYTES: 2390 cells/uL (ref 850–3900)
ABS. MONOCYTES: 555 cells/uL (ref 200–950)
ABS. NEUTROPHILS: 4211 cells/uL (ref 1500–7800)
BASOPHILS: 1.1 % (ref 0–2)
EOSINOPHILS: 2.2 % (ref 0–8)
HCT: 41.1 % (ref 35.0–45.0)
HGB: 13.8 g/dL (ref 11.7–15.5)
LYMPHOCYTES: 32.3 % (ref 15–49)
MCH: 28.5 pg (ref 27.0–33.0)
MCHC: 33.6 g/dL (ref 32.0–36.0)
MCV: 84.9 fL (ref 80.0–100.0)
MEAN PLATELET VOLUME: 9.4 fL (ref 7.5–12.5)
MONOCYTES: 7.5 % (ref 0–13)
Neutrophils: 56.9 % (ref 38–80)
PLATELET: 341 10*3/uL (ref 140–400)
RBC: 4.84 10*6/uL (ref 3.80–5.10)
RDW: 14.4 % (ref 11.0–15.0)
WBC: 7.4 10*3/uL (ref 3.8–10.8)

## 2017-09-07 LAB — HEMOGLOBIN A1C W/O EAG
Hemoglobin A1C: 5.7 % of total Hgb — ABNORMAL HIGH (ref <5.7)
Hemoglobin A1c: 5.7 % of total Hgb — ABNORMAL HIGH (ref <5.7)

## 2017-09-07 LAB — TSH 3RD GENERATION
TSH: 1.2 mIU/L (ref 0.40–4.50)
TSH: 1.2 mIU/L (ref 0.40–4.50)

## 2017-09-07 LAB — CK
CK: 90 U/L (ref 29–143)
Total CK: 90 U/L (ref 29–143)

## 2017-09-07 LAB — COMPREHENSIVE METABOLIC PANEL
ALT: 13 U/L (ref 6–29)
AST: 15 U/L (ref 10–35)
Albumin/Globulin Ratio: 1.8 (calc) (ref 1.0–2.5)
Albumin: 4.2 g/dL (ref 3.6–5.1)
Alkaline Phosphatase: 75 U/L (ref 33–130)
BUN: 21 mg/dL (ref 7–25)
CO2: 27 mmol/L (ref 20–32)
Calcium: 9.7 mg/dL (ref 8.6–10.4)
Chloride: 105 mmol/L (ref 98–110)
Creatinine: 0.7 mg/dL (ref 0.60–0.93)
EGFR IF NonAfrican American: 84 mL/min/{1.73_m2} (ref >=60)
GFR African American: 98 mL/min/{1.73_m2} (ref >=60)
GLUCOSE, FASTING,GF: 102 mg/dL — ABNORMAL HIGH (ref 65–99)
Globulin: 2.3 g/dL (calc) (ref 1.9–3.7)
Potassium: 4.3 mmol/L (ref 3.5–5.3)
Sodium: 139 mmol/L (ref 135–146)
Total Bilirubin: 0.6 mg/dL (ref 0.2–1.2)
Total Protein: 6.5 g/dL (ref 6.1–8.1)

## 2017-09-07 LAB — CBC WITH AUTO DIFFERENTIAL
Basophils %: 1.1 % (ref 0–2)
Basophils Absolute: 81 cells/uL (ref 0–200)
Eosinophils %: 2.2 % (ref 0–8)
Eosinophils Absolute: 163 cells/uL (ref 15–500)
Hematocrit: 41.1 % (ref 35.0–45.0)
Hemoglobin: 13.8 g/dL (ref 11.7–15.5)
Lymphocytes %: 32.3 % (ref 15–49)
Lymphocytes Absolute: 2390 cells/uL (ref 850–3900)
MCH: 28.5 pg (ref 27.0–33.0)
MCHC: 33.6 g/dL (ref 32.0–36.0)
MCV: 84.9 fL (ref 80.0–100.0)
MPV: 9.4 fL (ref 7.5–12.5)
Monocytes %: 7.5 % (ref 0–13)
Monocytes Absolute: 555 cells/uL (ref 200–950)
Neutrophils %: 56.9 % (ref 38–80)
Neutrophils Absolute: 4211 cells/uL (ref 1500–7800)
Platelets: 341 10*3/uL (ref 140–400)
RBC: 4.84 10*6/uL (ref 3.80–5.10)
RDW: 14.4 % (ref 11.0–15.0)
WBC: 7.4 10*3/uL (ref 3.8–10.8)

## 2017-09-07 NOTE — Telephone Encounter (Signed)
[facility]

## 2017-09-09 NOTE — Telephone Encounter (Signed)
Faxed.

## 2017-09-12 NOTE — Telephone Encounter (Signed)
Dr.Singh's office called, pt is in the office now  MD is requesting pts most recent labs to be faxed over  Fax to [phone]

## 2017-09-12 NOTE — Telephone Encounter (Signed)
Faxed recent labs

## 2017-09-16 NOTE — Telephone Encounter (Signed)
Pt called said Dr Roger ShelterGordon called her 8 this morning. She is returning the call

## 2017-10-07 ENCOUNTER — Ambulatory Visit: Admit: 2017-10-07 | Discharge: 2017-10-07 | Payer: MEDICARE | Attending: Internal Medicine | Primary: Internal Medicine

## 2017-10-07 ENCOUNTER — Ambulatory Visit: Attending: Internal Medicine | Primary: Internal Medicine

## 2017-10-07 DIAGNOSIS — E119 Type 2 diabetes mellitus without complications: Secondary | ICD-10-CM

## 2017-10-07 NOTE — Progress Notes (Signed)
Chief Complaint   Patient presents with   ??? Hypothyroidism     Follow up visit      1940/05/08  Immunization History   Administered Date(s) Administered   ??? Influenza High Dose Vaccine PF 01/26/2015   ??? Pneumococcal Conjugate (PCV-13) 11/29/2013   ??? Pneumococcal Polysaccharide (PPSV-23) 01/25/2006   ??? Zoster Vaccine, Live 06/29/2013       HISTORIES & HABITS  Medical History:   Past Medical History:   Diagnosis Date   ??? Colon polyps    ??? Depression    ??? Diabetes mellitus (HCC)    ??? High cholesterol    ??? Hypertension    ??? Hypothyroid     hypothyroid and parathyroid adenoma   ??? Osteoarthritis     had bilateral injections hyalgan in knees   ??? Osteoporosis    ??? Overweight      Surgery History:   Past Surgical History:   Procedure Laterality Date   ??? HX BREAST BIOPSY     ??? HX COLONOSCOPY  11/2013   ??? HX HEENT      parathyroid adenoma   ??? HX HIP REPLACEMENT     ??? HX LAP CHOLECYSTECTOMY     ??? HX PARATHYROIDECTOMY       Family History:   Family History   Problem Relation Age of Onset   ??? Hypertension Mother    ??? Colon Cancer Sister      Social History     Socioeconomic History   ??? Marital status: MARRIED     Spouse name: Not on file   ??? Number of children: Not on file   ??? Years of education: Not on file   ??? Highest education level: Not on file   Tobacco Use   ??? Smoking status: Former Smoker     Packs/day: 0.10     Years: 15.00     Pack years: 1.50     Types: Cigarettes     Last attempt to quit: 06/29/2012     Years since quitting: 5.2   ??? Smokeless tobacco: Never Used   Substance and Sexual Activity   ??? Alcohol use: No     Alcohol/week: 0.0 oz     Health Maintenance   Topic Date Due   ??? FOOT EXAM Q1  05/12/1950   ??? MICROALBUMIN Q1  05/12/1951   ??? EYE EXAM RETINAL OR DILATED  05/12/1951   ??? DTaP/Tdap/Td series (1 - Tdap) 05/11/1962   ??? Shingrix Vaccine Age 54> (1 of 2) 05/12/1991   ??? Pneumococcal 65+ years (2 of 2 - PPSV23) 11/30/2014   ??? Bone Densitometry  08/11/2015   ??? BREAST CANCER SCRN MAMMOGRAM  10/10/2017   ??? Influenza Age  73 to Adult  10/24/2017   ??? HEMOGLOBIN A1C Q6M  03/08/2018   ??? MEDICARE YEARLY EXAM  05/16/2018   ??? LIPID PANEL Q1  09/07/2018   ??? GLAUCOMA SCREENING Q2Y  02/07/2019   ??? COLONOSCOPY  12/22/2026     Allergies   Allergen Reactions   ??? Statins-Hmg-Coa Reductase Inhibitors Myalgia       Current Outpatient Medications:   ???  ezetimibe (ZETIA) 10 mg tablet, Take  by mouth., Disp: , Rfl:   ???  levothyroxine (SYNTHROID) 125 mcg tablet, TAKE 1 TABLET EVERY DAY BEFORE BREAKFAST, Disp: 90 Tab, Rfl: 3  ???  losartan (COZAAR) 50 mg tablet, Take 1 Tab by mouth daily., Disp: 90 Tab, Rfl: 1  ???  metFORMIN ER (GLUCOPHAGE XR) 750 mg  tablet, TAKE 1 TABLET DAILY (Patient taking differently: TAKE 1/2 TIW), Disp: 90 Tab, Rfl: 3  ???  spironolactone-hydrochlorothiazide (ALDACTAZIDE) 25-25 mg per tablet, TAKE 1 TABLET BY MOUTH DAILY., Disp: 90 Tab, Rfl: 3  ???  FLOVENT HFA 110 mcg/actuation inhaler, TAKE 2 PUFFS BY INHALATION EVERY TWELVE (12) HOURS., Disp: 12 Inhaler, Rfl: 1  ???  multivitamin (ONE A DAY) tablet, Take 1 Tab by mouth daily., Disp: , Rfl:   ???  B.infantis-B.ani-B.long-B.bifi 10-15 mg TbEC, Take  by mouth., Disp: , Rfl:   ???  cholecalciferol (VITAMIN D3) 1,000 unit cap, Take  by mouth daily., Disp: , Rfl:   ???  cyanocobalamin (VITAMIN B-12) 1,000 mcg sublingual tablet, Take 1,000 mcg by mouth daily., Disp: , Rfl:       Visit Vitals  BP 130/80 (BP 1 Location: Left arm, BP Patient Position: Sitting)   Pulse 95   Ht 5\' 8"  (1.727 m)   Wt 111.1 kg (245 lb)   SpO2 96%   BMI 37.25 kg/m??       Chief Complaint   Patient presents with   ??? Hypothyroidism     Follow up visit      HPI: follow up   Melissa Mclean has h/o parathyroid adenoma, hypothyroidism, calcium score 730, overweight, high cholesterol, prediabetes and B12 deficiency now dedicated to weight loss.  Tried crestor 2x week could not tolerate- sees IP Thedore Mins- nuclear stress test scheduled    Weight stable   Had colonsocpy unable to advance Dr Allena Katz- instead ordered  Barium enema terrible  experience Dr Allena Katz did not follow up- scar tissue from prior polyps likely  Cologuard in 2021 instead of colonoscopy, fam hist colon ca  Taking 1/2 metformin and aldactizide 1/2 BP low no side effects  ??  Review of System  Annual: 05/15/17  General: denies fatigue, sleep problems, weight  Loss 9#  Eyes: denies eye pain, change in vision, blurring  ENT: denies nosebleeds, sore throat, sinus  Cardiovascular: denies chest pains, palpitations, dyspnea on exertion, edema- unable to tolerate statin BIW 06/27/17 Calcium score 730  particle number 1399 and pattern A on zetia seeing IP Singh stress test 7/19  5/1/319 note from pt seeing Dr Esmond Camper Thedore Mins echo nl 5/19 cant toletate statins even twice a week not thinking psk-9 right now; 7/19 nuclear stress test this week  Respiratory: no cough no wheeze- bronchospasm in past usually after bronchitis   Gastrointestinal: denies abdominal pain, change in bowel habits, GERD  GU: denies UTI history, nocturia  Musculoskeletal: denies back pain, joint pain- knees improved with diet- broken toe 4th right foot 1/19   Skin: denies rash, change in moles, excema  Neuro: nl ambulation, cognitive nl  Psychiatric: No memory change, mood stable  HemeLymphatic: denies abnormal bruising, bleeding, enlarged lymph nodes, anemia  Allergic/Immunologic: denies persistent infections, HIV exposure, hay fever   Exercise:walking   Diet: healthy vegetables chicken eggs some bread and pasta  Reports varicella  ??  Physical Exam  General: well nourished, well hydrated, no acute distress  Head: normocephalic  Eye: conjunctivae and lids normal  QQV:ZDGL Cavity: no lesion, pharynx normal  Neck: supple, no masses, no lymphadenopathy  Respiratory: no rales, rhonchi, wheeze  Cardiovascular: RRR, S1 and S2 normal, no murmur  Breast: deferred  Peripheral circulation: no cyanosis, clubbing, edema + spider veins  Gastrointestinal: NTND no HSM  Skin: no rashes, change in moles  Neurologic: non focal vibration intact  distal  Cranial nerves: II - XII grossly intact  ZOX:WRUEAVWUJ and insight: intact  WJX:BJYN and neck: normal alignment and mobility  Digit and nail: no clubbing, cyanosis, petechiae, or nodes  Spine, rib, pelvis: normal alignment, no deformity  Gait and station: normal- right 4th toe slight increase - no erythema healed  ????  Assessment-Plan: follow up  - concern about weight regain discussed strategies reviewed- dedicated to weight loss  - BP controlled on losartan 50  - stop metformin a1c 5.7 if additional 10 pound weight loss  - follow tsh and intact PTH with prior history  - broken toe getting better- call if problem  - high cholesterol on zetia cant tolerate any statin- stress test 7/19  - knees fish oil rec consider senior yoga  - mammogarm ordered and dexa 7/20  shingrix at pharmacy   pneumavax rec next year- pt defers    ??  DATA 11/16 chol 252/58/163/157 a1c 6.1 tsh 1.2 cmp nl   06/13/15 ldl particle 2049 ldl 164 chol 256/59/164/163 tsh 2.0 a1c 5.9  12/06/15 cmp nl chol 259/55/164/198 tsh 2.3 PTH 27 a1c 6.0  08/03/16 cmp nl chol 242/66/145/168 tsh 1.4 a1c 5.8 d 43 increase b12 continue weight loss offer bydureon pen to pt  12/21/16 note Dr Allena Katz colonoscopy unalbe to advance scope likely order cologuard; BE if needed  01/14/17 cbc nl cmp nl tsh 2 a1c 5.7 pth 26 b12 368 chol 273/72/165/199   Spoke to pt - took lab after cruise, add b12 500 BIW recheck cholesterol with improved diet  Had colonoscopy couldn't go through scheduled barium enema  05/14/17 cmp nl glu 108 a1c 5.8 b12 809 tsh 1.83 chol 217/69/127/107 ratio 3.1 ldl particle 1399 size nl pattern A peak size normal apoB 100 lpa-152 hscrp 1.6 LP-PLA2 103 message left still recommend calcium score for confirmation that medication not necessary  06/27/17 refer to cardiology Calcium score 730 cant take statins losing weight cholesterol lower than in past, particle number 1399 and pattern A start zetia  consider PSK-9 inhibitor   5/1/319 note from pt seeing Dr  Thedore Mins echo scheduled started on crestor 5 BIW in addition to crestor - not thinking psk-9 right now  09/07/17 cbc nl cmp glu 102 k 4.3 cr 0.7 a1c 5.7 tsh 1.2 ck 90 chol 228/62/136/164 not on crestor or zetia  same medication fax lab to dr IP  singh has appt this week  09/16/17 echo Dr Thedore Mins bp 135/90 nl asking about need for stress test   12/28/17 Dr IP singh noted lft nl chol 210/83/110/84 direct LDL 118  02/21/18 phone call flew in last week very busy ankles so swollen weight watchers   [facility] more salt this week- did not take aldactizide with her       Discussed the patient's BMI with her.  I have recommended the following interventions: dietary management education, guidance, and counseling.       I attest that current meds have been reviewed and are accurate.      Follow-up and Dispositions    ?? Return in about 6 months (around 04/09/2018).         Counseling >50% time spent this visit      Encounter Diagnoses     ICD-10-CM ICD-9-CM   1. Type 2 diabetes mellitus without complication, without long-term current use of insulin (HCC) E11.9 250.00   2. High cholesterol E78.00 272.0   3. Hypothyroidism due to Hashimoto's thyroiditis E03.8 244.8    E06.3 245.2   4. H/O parathyroidectomy Z98.890  V15.29   5. Breast screening, unspecified Z12.31 V76.10     Orders Placed This Encounter   ??? MAM MAMMO BI SCREENING INCL CAD     Standing Status:   Future     Standing Expiration Date:   11/06/2018     Order Specific Question:   Reason for Exam     Answer:   screening   ??? ezetimibe (ZETIA) 10 mg tablet     Sig: Take  by mouth.   ??? DISCONTD: rosuvastatin (CRESTOR) 5 mg tablet     Sig: TAKE 1 TABLET BY MOUTH EVERY DAY     Refill:  0       Sincerely,    Edwin CapEmily Irma Roulhac, MD  Note Signed Electronically

## 2017-10-07 NOTE — Patient Instructions (Signed)
[facility] - chair yoga   Or at seniors

## 2017-10-07 NOTE — Progress Notes (Addendum)
Chief Complaint   Patient presents with   ??? Hypothyroidism     Follow up visit      Jul 25, 1940  Immunization History   Administered Date(s) Administered   ??? Influenza High Dose Vaccine PF 01/26/2015   ??? Pneumococcal Conjugate (PCV-13) 11/29/2013   ??? Pneumococcal Polysaccharide (PPSV-23) 01/25/2006   ??? Zoster Vaccine, Live 06/29/2013       HISTORIES & HABITS  Medical History:   Past Medical History:   Diagnosis Date   ??? Colon polyps    ??? Depression    ??? Diabetes mellitus (HCC)    ??? High cholesterol    ??? Hypertension    ??? Hypothyroid     hypothyroid and parathyroid adenoma   ??? Osteoarthritis     had bilateral injections hyalgan in knees   ??? Osteoporosis    ??? Overweight      Surgery History:   Past Surgical History:   Procedure Laterality Date   ??? HX BREAST BIOPSY     ??? HX COLONOSCOPY  11/2013   ??? HX HEENT      parathyroid adenoma   ??? HX HIP REPLACEMENT     ??? HX LAP CHOLECYSTECTOMY     ??? HX PARATHYROIDECTOMY       Family History:   Family History   Problem Relation Age of Onset   ??? Hypertension Mother    ??? Colon Cancer Sister      Social History     Socioeconomic History   ??? Marital status: MARRIED     Spouse name: Not on file   ??? Number of children: Not on file   ??? Years of education: Not on file   ??? Highest education level: Not on file   Tobacco Use   ??? Smoking status: Former Smoker     Packs/day: 0.10     Years: 15.00     Pack years: 1.50     Types: Cigarettes     Last attempt to quit: 06/29/2012     Years since quitting: 5.2   ??? Smokeless tobacco: Never Used   Substance and Sexual Activity   ??? Alcohol use: No     Alcohol/week: 0.0 oz     Health Maintenance   Topic Date Due   ??? FOOT EXAM Q1  05/12/1951   ??? MICROALBUMIN Q1  05/12/1951   ??? EYE EXAM RETINAL OR DILATED  05/12/1951   ??? DTaP/Tdap/Td series (1 - Tdap) 05/11/1962   ??? Shingrix Vaccine Age 76> (1 of 2) 05/12/1991   ??? Pneumococcal 65+ years (2 of 2 - PPSV23) 11/30/2014   ??? Bone Densitometry  08/11/2015   ??? BREAST CANCER SCRN MAMMOGRAM  10/10/2017    ??? Influenza Age 61 to Adult  10/24/2017   ??? HEMOGLOBIN A1C Q6M  03/08/2018   ??? MEDICARE YEARLY EXAM  05/16/2018   ??? LIPID PANEL Q1  09/07/2018   ??? GLAUCOMA SCREENING Q2Y  02/07/2019   ??? COLONOSCOPY  12/22/2026     Allergies   Allergen Reactions   ??? Statins-Hmg-Coa Reductase Inhibitors Myalgia       Current Outpatient Medications:   ???  ezetimibe (ZETIA) 10 mg tablet, Take  by mouth., Disp: , Rfl:   ???  levothyroxine (SYNTHROID) 125 mcg tablet, TAKE 1 TABLET EVERY DAY BEFORE BREAKFAST, Disp: 90 Tab, Rfl: 3  ???  losartan (COZAAR) 50 mg tablet, Take 1 Tab by mouth daily., Disp: 90 Tab, Rfl: 1  ???  metFORMIN ER (GLUCOPHAGE XR) 750 mg  tablet, TAKE 1 TABLET DAILY (Patient taking differently: TAKE 1/2 TIW), Disp: 90 Tab, Rfl: 3  ???  spironolactone-hydrochlorothiazide (ALDACTAZIDE) 25-25 mg per tablet, TAKE 1 TABLET BY MOUTH DAILY., Disp: 90 Tab, Rfl: 3  ???  FLOVENT HFA 110 mcg/actuation inhaler, TAKE 2 PUFFS BY INHALATION EVERY TWELVE (12) HOURS., Disp: 12 Inhaler, Rfl: 1  ???  multivitamin (ONE A DAY) tablet, Take 1 Tab by mouth daily., Disp: , Rfl:   ???  B.infantis-B.ani-B.long-B.bifi 10-15 mg TbEC, Take  by mouth., Disp: , Rfl:   ???  cholecalciferol (VITAMIN D3) 1,000 unit cap, Take  by mouth daily., Disp: , Rfl:   ???  cyanocobalamin (VITAMIN B-12) 1,000 mcg sublingual tablet, Take 1,000 mcg by mouth daily., Disp: , Rfl:       Visit Vitals  BP 130/80 (BP 1 Location: Left arm, BP Patient Position: Sitting)   Pulse 95   Ht 5\' 8"  (1.727 m)   Wt 111.1 kg (245 lb)   SpO2 96%   BMI 37.25 kg/m??       Chief Complaint   Patient presents with   ??? Hypothyroidism     Follow up visit      HPI: follow up   Wendall Stade has h/o parathyroid adenoma, hypothyroidism, calcium score 730, overweight, high cholesterol, prediabetes and B12 deficiency now dedicated to weight loss.  Tried crestor 2x week could not tolerate- sees IP Thedore Mins- nuclear stress test scheduled    Weight stable    Had colonsocpy unable to advance Dr Allena Katz- instead ordered  Barium enema terrible experience Dr Allena Katz did not follow up- scar tissue from prior polyps likely  Cologuard in 2021 instead of colonoscopy, fam hist colon ca  Taking 1/2 metformin and aldactizide 1/2 BP low no side effects  ??  Review of System  Annual: 05/15/17  General: denies fatigue, sleep problems, weight  Loss 9#  Eyes: denies eye pain, change in vision, blurring  ENT: denies nosebleeds, sore throat, sinus  Cardiovascular: denies chest pains, palpitations, dyspnea on exertion, edema- unable to tolerate statin BIW 06/27/17 Calcium score 730  particle number 1399 and pattern A on zetia seeing IP Singh stress test 7/19  5/1/319 note from pt seeing Dr Esmond Camper Thedore Mins echo nl 5/19 cant toletate statins even twice a week not thinking psk-9 right now; 7/19 nuclear stress test this week  Respiratory: no cough no wheeze- bronchospasm in past usually after bronchitis   Gastrointestinal: denies abdominal pain, change in bowel habits, GERD  GU: denies UTI history, nocturia  Musculoskeletal: denies back pain, joint pain- knees improved with diet- broken toe 4th right foot 1/19   Skin: denies rash, change in moles, excema  Neuro: nl ambulation, cognitive nl  Psychiatric: No memory change, mood stable  HemeLymphatic: denies abnormal bruising, bleeding, enlarged lymph nodes, anemia  Allergic/Immunologic: denies persistent infections, HIV exposure, hay fever   Exercise:walking   Diet: healthy vegetables chicken eggs some bread and pasta  Reports varicella  ??  Physical Exam  General: well nourished, well hydrated, no acute distress  Head: normocephalic  Eye: conjunctivae and lids normal  ZOX:WRUE Cavity: no lesion, pharynx normal  Neck: supple, no masses, no lymphadenopathy  Respiratory: no rales, rhonchi, wheeze  Cardiovascular: RRR, S1 and S2 normal, no murmur  Breast: deferred  Peripheral circulation: no cyanosis, clubbing, edema + spider veins  Gastrointestinal: NTND no HSM   Skin: no rashes, change in moles  Neurologic: non focal vibration intact distal  Cranial nerves: II - XII grossly intact  YQM:VHQIONGEX and insight: intact  BMW:UXLK and neck: normal alignment and mobility  Digit and nail: no clubbing, cyanosis, petechiae, or nodes  Spine, rib, pelvis: normal alignment, no deformity  Gait and station: normal- right 4th toe slight increase - no erythema healed  ????  Assessment-Plan: follow up  - concern about weight regain discussed strategies reviewed- dedicated to weight loss  - BP controlled on losartan 50  - stop metformin a1c 5.7 if additional 10 pound weight loss  - follow tsh and intact PTH with prior history  - broken toe getting better- call if problem  - high cholesterol on zetia cant tolerate any statin- stress test 7/19  - knees fish oil rec consider senior yoga  - mammogarm ordered and dexa 7/20  shingrix at pharmacy   pneumavax rec next year- pt defers    ??  DATA 11/16 chol 252/58/163/157 a1c 6.1 tsh 1.2 cmp nl   06/13/15 ldl particle 2049 ldl 164 chol 256/59/164/163 tsh 2.0 a1c 5.9  12/06/15 cmp nl chol 259/55/164/198 tsh 2.3 PTH 27 a1c 6.0  08/03/16 cmp nl chol 242/66/145/168 tsh 1.4 a1c 5.8 d 43 increase b12 continue weight loss offer bydureon pen to pt  12/21/16 note Dr Allena Katz colonoscopy unalbe to advance scope likely order cologuard; BE if needed  01/14/17 cbc nl cmp nl tsh 2 a1c 5.7 pth 26 b12 368 chol 273/72/165/199   Spoke to pt - took lab after cruise, add b12 500 BIW recheck cholesterol with improved diet  Had colonoscopy couldn't go through scheduled barium enema  05/14/17 cmp nl glu 108 a1c 5.8 b12 809 tsh 1.83 chol 217/69/127/107 ratio 3.1 ldl particle 1399 size nl pattern A peak size normal apoB 100 lpa-152 hscrp 1.6 LP-PLA2 103 message left still recommend calcium score for confirmation that medication not necessary  06/27/17 refer to cardiology Calcium score 730 cant take statins losing weight cholesterol lower than in past, particle number 1399 and pattern A  start zetia  consider PSK-9 inhibitor   5/1/319 note from pt seeing Dr Thedore Mins echo scheduled started on crestor 5 BIW in addition to crestor - not thinking psk-9 right now  09/07/17 cbc nl cmp glu 102 k 4.3 cr 0.7 a1c 5.7 tsh 1.2 ck 90 chol 228/62/136/164 not on crestor or zetia  same medication fax lab to dr IP  singh has appt this week  09/16/17 echo Dr Thedore Mins bp 135/90 nl asking about need for stress test   12/28/17 Dr IP singh noted lft nl chol 210/83/110/84 direct LDL 118  02/21/18 phone call flew in last week very busy ankles so swollen weight watchers   [facility] more salt this week- did not take aldactizide with her       Discussed the patient's BMI with her.  I have recommended the following interventions: dietary management education, guidance, and counseling.       I attest that current meds have been reviewed and are accurate.      Follow-up and Dispositions    ?? Return in about 6 months (around 04/09/2018).         Counseling >50% time spent this visit      Encounter Diagnoses     ICD-10-CM ICD-9-CM   1. Type 2 diabetes mellitus without complication, without long-term current use of insulin (HCC) E11.9 250.00   2. High cholesterol E78.00 272.0   3. Hypothyroidism due to Hashimoto's thyroiditis E03.8 244.8    E06.3 245.2   4. H/O parathyroidectomy Z98.890  V15.29   5. Breast screening, unspecified Z12.31 V76.10     Orders Placed This Encounter   ??? MAM MAMMO BI SCREENING INCL CAD     Standing Status:   Future     Standing Expiration Date:   11/06/2018     Order Specific Question:   Reason for Exam     Answer:   screening   ??? ezetimibe (ZETIA) 10 mg tablet     Sig: Take  by mouth.   ??? DISCONTD: rosuvastatin (CRESTOR) 5 mg tablet     Sig: TAKE 1 TABLET BY MOUTH EVERY DAY     Refill:  0       Sincerely,    Edwin CapEmily Rivan Siordia, MD  Note Signed Electronically

## 2017-11-06 MED ORDER — LOSARTAN 50 MG TAB
50 mg | ORAL_TABLET | Freq: Every day | ORAL | 1 refills | Status: DC
Start: 2017-11-06 — End: 2018-08-12

## 2017-11-06 NOTE — Telephone Encounter (Signed)
Pt called on rx line stating her pharmacy [facility] in [place] has tried to reach our office 3 times regarding her refill. She needs a refill on losartan (did not leave dose)

## 2017-11-06 NOTE — Telephone Encounter (Signed)
rx sent

## 2017-11-09 MED ORDER — LOSARTAN 50 MG TAB
50 mg | ORAL_TABLET | ORAL | 0 refills | Status: DC
Start: 2017-11-09 — End: 2018-02-25

## 2018-01-05 MED ORDER — LEVOTHYROXINE 125 MCG TAB
125 mcg | ORAL_TABLET | ORAL | 3 refills | Status: DC
Start: 2018-01-05 — End: 2018-10-18

## 2018-02-21 MED ORDER — SPIRONOLACTON-HYDROCHLOROTHIAZ 25 MG-25 MG TAB
25-25 mg | ORAL_TABLET | Freq: Every day | ORAL | 3 refills | Status: DC
Start: 2018-02-21 — End: 2019-01-28

## 2018-02-25 ENCOUNTER — Ambulatory Visit: Admit: 2018-02-25 | Discharge: 2018-02-25 | Payer: MEDICARE | Attending: Internal Medicine | Primary: Internal Medicine

## 2018-02-25 ENCOUNTER — Ambulatory Visit: Attending: Internal Medicine | Primary: Internal Medicine

## 2018-02-25 DIAGNOSIS — R600 Localized edema: Secondary | ICD-10-CM

## 2018-02-25 NOTE — Progress Notes (Signed)
Chief Complaint   Patient presents with   ??? Cough   ??? Ankle swelling     BOTH     11/27/1940  Immunization History   Administered Date(s) Administered   ??? Influenza High Dose Vaccine PF 01/26/2015   ??? Pneumococcal Conjugate (PCV-13) 11/29/2013   ??? Pneumococcal Polysaccharide (PPSV-23) 01/25/2006   ??? Zoster Vaccine, Live 06/29/2013       HISTORIES & HABITS  Medical History:   Past Medical History:   Diagnosis Date   ??? Colon polyps    ??? Depression    ??? Diabetes mellitus (HCC)    ??? High cholesterol    ??? Hypertension    ??? Hypothyroid     hypothyroid and parathyroid adenoma   ??? Osteoarthritis     had bilateral injections hyalgan in knees   ??? Osteoporosis    ??? Overweight      Surgery History:   Past Surgical History:   Procedure Laterality Date   ??? HX BREAST BIOPSY     ??? HX COLONOSCOPY  11/2013   ??? HX HEENT      parathyroid adenoma   ??? HX HIP REPLACEMENT     ??? HX LAP CHOLECYSTECTOMY     ??? HX PARATHYROIDECTOMY       Family History:   Family History   Problem Relation Age of Onset   ??? Hypertension Mother    ??? Colon Cancer Sister      Social History     Socioeconomic History   ??? Marital status: MARRIED     Spouse name: Not on file   ??? Number of children: Not on file   ??? Years of education: Not on file   ??? Highest education level: Not on file   Tobacco Use   ??? Smoking status: Former Smoker     Packs/day: 0.10     Years: 15.00     Pack years: 1.50     Types: Cigarettes     Last attempt to quit: 06/29/2012     Years since quitting: 5.6   ??? Smokeless tobacco: Never Used   Substance and Sexual Activity   ??? Alcohol use: No     Alcohol/week: 0.0 standard drinks     Health Maintenance   Topic Date Due   ??? FOOT EXAM Q1  05/12/1950   ??? MICROALBUMIN Q1  05/12/1950   ??? EYE EXAM RETINAL OR DILATED  05/12/1950   ??? DTaP/Tdap/Td series (1 - Tdap) 05/11/1952   ??? Shingrix Vaccine Age 76> (1 of 2) 05/12/1991   ??? Pneumococcal 65+ years (2 of 2 - PPSV23) 11/30/2014   ??? Bone Densitometry  08/11/2015   ??? BREAST CANCER SCRN MAMMOGRAM  10/10/2017   ???  Influenza Age 76 to Adult  10/24/2017   ??? HEMOGLOBIN A1C Q6M  03/08/2018   ??? MEDICARE YEARLY EXAM  05/16/2018   ??? LIPID PANEL Q1  09/07/2018   ??? GLAUCOMA SCREENING Q2Y  02/07/2019   ??? COLONOSCOPY  12/22/2026     Allergies   Allergen Reactions   ??? Statins-Hmg-Coa Reductase Inhibitors Myalgia       Current Outpatient Medications:   ???  spironolactone-hydrochlorothiazide (ALDACTAZIDE) 25-25 mg per tablet, Take 1 Tab by mouth daily., Disp: 90 Tab, Rfl: 3  ???  levothyroxine (SYNTHROID) 125 mcg tablet, TAKE 1 TABLET EVERY DAY BEFORE BREAKFAST, Disp: 90 Tab, Rfl: 3  ???  losartan (COZAAR) 50 mg tablet, Take 1 Tab by mouth daily., Disp: 90 Tab, Rfl: 1  ???  ezetimibe (ZETIA) 10 mg tablet, Take  by mouth., Disp: , Rfl:   ???  FLOVENT HFA 110 mcg/actuation inhaler, TAKE 2 PUFFS BY INHALATION EVERY TWELVE (12) HOURS., Disp: 12 Inhaler, Rfl: 1  ???  multivitamin (ONE A DAY) tablet, Take 1 Tab by mouth daily., Disp: , Rfl:   ???  B.infantis-B.ani-B.long-B.bifi 10-15 mg TbEC, Take  by mouth., Disp: , Rfl:   ???  cholecalciferol (VITAMIN D3) 1,000 unit cap, Take  by mouth daily., Disp: , Rfl:   ???  cyanocobalamin (VITAMIN B-12) 1,000 mcg sublingual tablet, Take 1,000 mcg by mouth daily., Disp: , Rfl:       Visit Vitals  BP 118/80 (BP 1 Location: Left arm, BP Patient Position: Sitting)   Pulse (!) 118   Ht 5\' 8"  (1.727 m)   Wt 111.6 kg (246 lb)   SpO2 96%   BMI 37.40 kg/m??       Chief Complaint   Patient presents with   ??? Cough   ??? Ankle swelling     BOTH     HPI: follow up   Wendall Stade has h/o parathyroid adenoma, hypothyroidism, calcium score 730, overweight, high cholesterol, prediabetes and B12 deficiency now dedicated to weight loss. Going to [employer].  Tried crestor 2x week could not tolerate- sees IP Thedore Mins- nuclear stress test and echo 7/19 normal. Saw Dr Thedore Mins 10/19   Swollen ankles when flew from [place] 1 week ago did not bring HCTZ- when called a few days ago thought it was aldactizide sent into pharmacy 2 days ago   Since  starting aldactizide ankles are going down significantly. 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  Review of System  Annual: 05/15/17  General: denies fatigue, sleep problems, weight  Loss 9#  Eyes: denies eye pain, change in vision, blurring  ENT: denies nosebleeds, sore throat, sinus  Cardiovascular: denies chest pains, palpitations, dyspnea on exertion, + ankle edema bilateral - unable to tolerate statin BIW 06/27/17 Calcium score 730  particle number [id number] and pattern A on zetia seeing IP Singh stress test 7/19  5/1/319 note from pt seeing Dr Esmond Camper Thedore Mins echo nl 5/19 cant toletate statins even twice a week not thinking psk-9 right now; 7/19 nuclear stress test this week  Respiratory: no cough no wheeze- bronchospasm in past usually after bronchitis   03/31/18 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   Gastrointestinal: denies abdominal pain, change in bowel habits, GERD  GU: denies UTI history, nocturia- 12/19 pelvic US 4 cm right ovarian cyst, 2 fibroids 3cm uterus repeat 1 year  Musculoskeletal: denies back pain, joint pain- knees improved with diet- broken toe 4th right foot 1/19   Skin: denies rash, change  in moles, excema  Neuro: nl ambulation, cognitive nl  Psychiatric: No memory change, mood  stable  HemeLymphatic: denies abnormal bruising, bleeding, enlarged lymph nodes, anemia  Allergic/Immunologic: denies persistent infections, HIV exposure, hay fever  +ANA 1:80  Exercise:walking   Diet: healthy vegetables chicken eggs some bread and pasta  Reports varicella  ??  Physical Exam  General: well nourished, well hydrated, no acute distress  Head: normocephalic  Eye: conjunctivae and lids normal  UEA:VWUJ Cavity: no lesion, pharynx normal  Neck: supple, no masses, no lymphadenopathy  Respiratory: no rales, rhonchi, wheeze  Cardiovascular: RRR, S1 and S2 normal, no murmur ST +edema ankles only neo erythema   Breast: deferred  Peripheral circulation: no cyanosis, clubbing, edema + spider veins  Gastrointestinal: NTND no HSM  Skin: no rashes, change in moles  Neurologic: non focal vibration intact distal  Cranial nerves: II - XII grossly intact  WJX:BJYNWGNFA and insight: intact  OZH:YQMV and neck: normal alignment and mobility  Digit and nail: no clubbing, cyanosis, petechiae, or nodes  Spine, rib, pelvis: normal alignment, no deformity  Gait and station: normal- right 4th toe slight increase  ????  Assessment-Plan: follow up  - Sinus tachycardia, regular rhythm on exam - noticed after pt left- will recall for ekg   tomorrow- stress test and echo nl dr Thedore Mins 7/19   02/26/18 EKG 92 NSR no ischemia low voltage  - edema after flying from [facility] bilateral ankles did not bring HCTZ- sent in aldactizide mistakenly (was in chart from prior) will check K and magnesium 1 week may decrease to to TIW if edema resolved- may change to HCTZ after labs  Check pelvic ultrasound at [facility] if persists  It is not recommended to fly right now- letter written for airline  02/27/18 legs feel very heavy- check pelvic US crp pending  - cramps legs start magnesium glycinate 100mg  bid  - cough irritant in office dry, unclear etiology on flovent - will check lab and CXR. Consider elimination diet and pulmonary consult if  persists.  Cough persists 02/28/18 add clarithromycin  03/31/18 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   - compression deformity on CXR_ h/o hyperparathyroidism check dexa consider boniva or prolia if low or refer to endocrine  -  BP controlled on losartan 50 and aldactizide  - calcium score 730 follow may need PSK-9 inhibitor in future  - exercise and swimming in [place] deconditioned - will start PT for muscle strengthening  - Weight loss on weight watchers- conitnue diet reviewed  - high cholesterol   - a1c 5.7 off metformin follow repeat 12/19 6.0  - follow tsh and intact PTH with prior history  - high cholesterol on zetia cant tolerate any statin- stress test and echo 7/19 nl  - mammogarm ordered and dexa 7/20  shingrix at pharmacy   pneumavax rec next year- pt defers    ??  DATA 11/16 chol 252/58/163/157 a1c 6.1 tsh 1.2 cmp nl   06/13/15 ldl particle 2049 ldl 164 chol 256/59/164/163 tsh 2.0 a1c 5.9  12/06/15 cmp nl chol 259/55/164/198 tsh 2.3 PTH 27 a1c 6.0  08/03/16 cmp nl chol 242/66/145/168 tsh 1.4 a1c 5.8 d 43 increase b12 continue weight loss offer bydureon pen to pt  12/21/16 note Dr Allena Katz colonoscopy unalbe to advance scope likely order cologuard; BE if needed  01/14/17 cbc nl cmp nl tsh 2 a1c 5.7 pth 26 b12 368 chol 273/72/165/199   Spoke to pt - took lab after cruise, add b12  500 BIW recheck cholesterol with improved diet  Had colonoscopy couldn't go through scheduled barium enema  05/14/17 cmp nl glu 108 a1c 5.8 b12 809 tsh 1.83 chol 217/69/127/107 ratio 3.1 ldl particle 1399 size nl pattern A peak size normal apoB 100 lpa-152 hscrp 1.6 LP-PLA2 103 message left still recommend calcium score for confirmation that medication not necessary  06/27/17 refer to cardiology Calcium score 730 cant take statins losing weight cholesterol lower than in past, particle number 1399 and pattern A start zetia  consider PSK-9 inhibitor   5/1/319 note from pt seeing Dr Thedore MinsSingh echo scheduled started on crestor 5 BIW in  addition to crestor - not thinking psk-9 right now  09/07/17 cbc nl cmp glu 102 k 4.3 cr 0.7 a1c 5.7 tsh 1.2 ck 90 chol 228/62/136/164 not on crestor or zetia  same medication fax lab to dr IP  singh has appt this week  09/16/17 echo Dr Thedore MinsSingh bp 135/90 nl asking about need for stress test   12/28/17 Dr IP singh noted lft nl chol 210/83/110/84 direct LDL 118  02/21/18 phone call flew in last week very busy ankles so swollen weight watchers   BP ok at [facility] no problem more salt this week- did not take aldactizide with her   Cough persists 02/28/18 add clarithromycin  03/04/18 cmp glu 105 sodium 134 k 4.5 ANA 1:80 homogeneous crp 5.8 mag 1.8 a1c 6 RF <14 tsh 1.66 messaged pt- go back to losartan and HCT for next refill spoke to pt   Had cholesterol with dr Thedore Minssingh   Cough is better on biaxin feeling well     DATA   02/27/18 CXR mild compression deformities mid thoracic spine no pulmonary disease- dexa ordered  02/27/18 pelvic US 4cm ovarian cyst smal fibroids 3cm repeat 1 year  03/03/18 dexa -1.4 s -1.3 h repeat 2-3 years   03/31/18 dr Emelda Fearferguson noted PFT restrictive lung disease     Discussed the patient's BMI with her.  I have recommended the following interventions: dietary management education, guidance, and counseling.       I attest that current meds have been reviewed and are accurate.      Follow-up and Dispositions    ?? Return in about 6 months (around 08/27/2018) for annual wellness.         Counseling >50% time spent this visit      Encounter Diagnoses     ICD-10-CM ICD-9-CM   1. Leg edema R60.0 782.3   2. Pelvic pain R10.2 [id number]   3. Type 2 diabetes mellitus without complication, without long-term current use of insulin (HCC) E11.9 250.00   4. Hypothyroidism due to Hashimoto's thyroiditis E03.8 244.8    E06.3 245.2   5. Chronic pain of both ankles M25.571 719.47    G89.29 338.29    M25.572    6. Physical deconditioning R53.81 799.3   7. Tachycardia, unspecified R00.0 785.0     Orders Placed This Encounter   ??? US  TRANSVAGINAL     Standing Status:   Future     Standing Expiration Date:   03/29/2019     Order Specific Question:   Reason for Exam     Answer:   pelvic pain   ??? ANTINUCLEAR ANTIBODIES, IFA   ??? C REACTIVE PROTEIN, QT   ??? MAGNESIUM   ??? METABOLIC PANEL, COMPREHENSIVE   ??? HEMOGLOBIN A1C W/O EAG   ??? RHEUMATOID FACTOR, QL   ??? TSH 3RD GENERATION   ??? REFERRAL TO  PHYSICAL THERAPY     Referral Priority:   Routine     Referral Type:   PT/OT/ST     Referral Reason:   Specialty Services Required       Sincerely,    Edwin Cap, MD  Note Signed Electronically

## 2018-02-25 NOTE — Telephone Encounter (Signed)
Patient called, she forgot to ask for a letter to [employer] stating that she is unable to fly.    Pt would like to pick it up    Pt ph- [phone]

## 2018-02-25 NOTE — Telephone Encounter (Signed)
done

## 2018-02-25 NOTE — Telephone Encounter (Signed)
Daughter picked it up

## 2018-02-25 NOTE — Progress Notes (Addendum)
Chief Complaint   Patient presents with   ??? Cough   ??? Ankle swelling     BOTH     [birth year]  Immunization History   Administered Date(s) Administered   ??? Influenza High Dose Vaccine PF 01/26/2015   ??? Pneumococcal Conjugate (PCV-13) 11/29/2013   ??? Pneumococcal Polysaccharide (PPSV-23) 01/25/2006   ??? Zoster Vaccine, Live 06/29/2013       HISTORIES & HABITS  Medical History:   Past Medical History:   Diagnosis Date   ??? Colon polyps    ??? Depression    ??? Diabetes mellitus (HCC)    ??? High cholesterol    ??? Hypertension    ??? Hypothyroid     hypothyroid and parathyroid adenoma   ??? Osteoarthritis     had bilateral injections hyalgan in knees   ??? Osteoporosis    ??? Overweight      Surgery History:   Past Surgical History:   Procedure Laterality Date   ??? HX BREAST BIOPSY     ??? HX COLONOSCOPY  11/2013   ??? HX HEENT      parathyroid adenoma   ??? HX HIP REPLACEMENT     ??? HX LAP CHOLECYSTECTOMY     ??? HX PARATHYROIDECTOMY       Family History:   Family History   Problem Relation Age of Onset   ??? Hypertension Mother    ??? Colon Cancer Sister      Social History     Socioeconomic History   ??? Marital status: MARRIED     Spouse name: Not on file   ??? Number of children: Not on file   ??? Years of education: Not on file   ??? Highest education level: Not on file   Tobacco Use   ??? Smoking status: Former Smoker     Packs/day: 0.10     Years: 15.00     Pack years: 1.50     Types: Cigarettes     Last attempt to quit: 06/29/2012     Years since quitting: 5.6   ??? Smokeless tobacco: Never Used   Substance and Sexual Activity   ??? Alcohol use: No     Alcohol/week: 0.0 standard drinks     Health Maintenance   Topic Date Due   ??? FOOT EXAM Q1  05/12/1950   ??? MICROALBUMIN Q1  05/12/1950   ??? EYE EXAM RETINAL OR DILATED  05/12/1950   ??? DTaP/Tdap/Td series (1 - Tdap) 05/11/1952   ??? Shingrix Vaccine Age 68> (1 of 2) 05/12/1991   ??? Pneumococcal 65+ years (2 of 2 - PPSV23) 11/30/2014   ??? Bone Densitometry  08/11/2015   ??? BREAST CANCER SCRN MAMMOGRAM  10/10/2017    ??? Influenza Age 85 to Adult  10/24/2017   ??? HEMOGLOBIN A1C Q6M  03/08/2018   ??? MEDICARE YEARLY EXAM  05/16/2018   ??? LIPID PANEL Q1  09/07/2018   ??? GLAUCOMA SCREENING Q2Y  02/07/2019   ??? COLONOSCOPY  12/22/2026     Allergies   Allergen Reactions   ??? Statins-Hmg-Coa Reductase Inhibitors Myalgia       Current Outpatient Medications:   ???  spironolactone-hydrochlorothiazide (ALDACTAZIDE) 25-25 mg per tablet, Take 1 Tab by mouth daily., Disp: 90 Tab, Rfl: 3  ???  levothyroxine (SYNTHROID) 125 mcg tablet, TAKE 1 TABLET EVERY DAY BEFORE BREAKFAST, Disp: 90 Tab, Rfl: 3  ???  losartan (COZAAR) 50 mg tablet, Take 1 Tab by mouth daily., Disp: 90 Tab, Rfl: 1  ???  ezetimibe (ZETIA) 10 mg tablet, Take  by mouth., Disp: , Rfl:   ???  FLOVENT HFA 110 mcg/actuation inhaler, TAKE 2 PUFFS BY INHALATION EVERY TWELVE (12) HOURS., Disp: 12 Inhaler, Rfl: 1  ???  multivitamin (ONE A DAY) tablet, Take 1 Tab by mouth daily., Disp: , Rfl:   ???  B.infantis-B.ani-B.long-B.bifi 10-15 mg TbEC, Take  by mouth., Disp: , Rfl:   ???  cholecalciferol (VITAMIN D3) 1,000 unit cap, Take  by mouth daily., Disp: , Rfl:   ???  cyanocobalamin (VITAMIN B-12) 1,000 mcg sublingual tablet, Take 1,000 mcg by mouth daily., Disp: , Rfl:       Visit Vitals  BP 118/80 (BP 1 Location: Left arm, BP Patient Position: Sitting)   Pulse (!) 118   Ht 5\' 8"  (1.727 m)   Wt 111.6 kg (246 lb)   SpO2 96%   BMI 37.40 kg/m??       Chief Complaint   Patient presents with   ??? Cough   ??? Ankle swelling     BOTH     HPI: follow up   Wendall Stade has h/o parathyroid adenoma, hypothyroidism, calcium score 730, overweight, high cholesterol, prediabetes and B12 deficiency now dedicated to weight loss. Going to [employer].  Tried crestor 2x week could not tolerate- sees IP Thedore Mins- nuclear stress test and echo 7/19 normal. Saw Dr Thedore Mins 10/19   Swollen ankles when flew from [place] 1 week ago did not bring HCTZ- when called a few days ago thought it was aldactizide sent into pharmacy 2 days ago    Since starting aldactizide ankles are going down significantly. 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  Review of System  Annual: 05/15/17  General: denies fatigue, sleep problems, weight  Loss 9#  Eyes: denies eye pain, change in vision, blurring  ENT: denies nosebleeds, sore throat, sinus  Cardiovascular: denies chest pains, palpitations, dyspnea on exertion, + ankle edema bilateral - unable to tolerate statin BIW 06/27/17 Calcium score 730  particle number [id number] and pattern A on zetia seeing IP Singh stress test 7/19  5/1/319 note from pt seeing Dr Esmond Camper Thedore Mins echo nl 5/19 cant toletate statins even twice a week not thinking psk-9 right now; 7/19 nuclear stress test this week  Respiratory: no cough no wheeze- bronchospasm in past usually after bronchitis   03/31/18 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   Gastrointestinal: denies abdominal pain, change in bowel habits, GERD  GU: denies UTI history, nocturia- 12/19 pelvic US 4 cm right ovarian cyst, 2 fibroids 3cm uterus repeat 1 year  Musculoskeletal: denies back pain, joint pain- knees improved with diet- broken toe 4th right foot 1/19   Skin: denies rash, change  in moles, excema  Neuro: nl ambulation, cognitive nl   Psychiatric: No memory change, mood stable  HemeLymphatic: denies abnormal bruising, bleeding, enlarged lymph nodes, anemia  Allergic/Immunologic: denies persistent infections, HIV exposure, hay fever  +ANA 1:80  Exercise:walking   Diet: healthy vegetables chicken eggs some bread and pasta  Reports varicella  ??  Physical Exam  General: well nourished, well hydrated, no acute distress  Head: normocephalic  Eye: conjunctivae and lids normal  ZOX:WRUEENT:Oral Cavity: no lesion, pharynx normal  Neck: supple, no masses, no lymphadenopathy  Respiratory: no rales, rhonchi, wheeze  Cardiovascular: RRR, S1 and S2 normal, no murmur ST +edema ankles only neo erythema   Breast: deferred  Peripheral circulation: no cyanosis, clubbing, edema + spider veins  Gastrointestinal: NTND no HSM  Skin: no rashes, change in moles  Neurologic: non focal vibration intact distal  Cranial nerves: II - XII grossly intact  AVW:UJWJXBJYNSE:Judgement and insight: intact  WGN:FAOZSK:Head and neck: normal alignment and mobility  Digit and nail: no clubbing, cyanosis, petechiae, or nodes  Spine, rib, pelvis: normal alignment, no deformity  Gait and station: normal- right 4th toe slight increase  ????  Assessment-Plan: follow up  - Sinus tachycardia, regular rhythm on exam - noticed after pt left- will recall for ekg   tomorrow- stress test and echo nl dr Thedore Minssingh 7/19   02/26/18 EKG 92 NSR no ischemia low voltage  - [place] HCT[place]- sent in aldactizide mistakenly (was in chart from prior) will check K and magnesium 1 week may decrease to to TIW if edema resolved- may change to HCTZ after labs  Check pelvic ultrasound at [facility] if persists  It is not recommended to fly right now- letter written for airline  02/27/18 legs feel very heavy- check pelvic us crp pending  - cramps legs start magnesium glycinate 100mg  bid  - cough irritant in office dry, unclear etiology on flovent - will check  lab and CXR. Consider elimination diet and pulmonary consult if persists.  Cough persists 02/28/18 add clarithromycin  03/31/18 dr Emelda Fearferguson noted PFT restrictive lung disease   - compression deformity on CXR_ h/o hyperparathyroidism check dexa consider boniva or prolia if low or refer to endocrine  -  BP controlled on losartan 50 and aldactizide  - calcium score 730 follow may need PSK-9 inhibitor in future  - exercise and swimming in [place] deconditioned - will start PT for muscle strengthening  - Weight loss on weight watchers- conitnue diet reviewed  - high cholesterol   - a1c 5.7 off metformin follow repeat 12/19 6.0  - follow tsh and intact PTH with prior history  - high cholesterol on zetia cant tolerate any statin- stress test and echo 7/19 nl  - mammogarm ordered and dexa 7/20  shingrix at pharmacy   pneumavax rec next year- pt defers    ??  DATA 11/16 chol 252/58/163/157 a1c 6.1 tsh 1.2 cmp nl   06/13/15 ldl particle 2049 ldl 164 chol 256/59/164/163 tsh 2.0 a1c 5.9  12/06/15 cmp nl chol 259/55/164/198 tsh 2.3 PTH 27 a1c 6.0  08/03/16 cmp nl chol 242/66/145/168 tsh 1.4 a1c 5.8 d 43 increase b12 continue weight loss offer bydureon pen to pt  12/21/16 note Dr Allena KatzPatel colonoscopy unalbe to advance scope likely order cologuard; BE if needed  01/14/17 cbc nl cmp nl tsh 2 a1c 5.7 pth 26 b12 368 chol 273/72/165/199   Spoke to pt - took lab after cruise, add b12  500 BIW recheck cholesterol with improved diet  Had colonoscopy couldn't go through scheduled barium enema  05/14/17 cmp nl glu 108 a1c 5.8 b12 809 tsh 1.83 chol 217/69/127/107 ratio 3.1 ldl particle 1399 size nl pattern A peak size normal apoB 100 lpa-152 hscrp 1.6 LP-PLA2 103 message left still recommend calcium score for confirmation that medication not necessary  06/27/17 refer to cardiology Calcium score 730 cant take statins losing weight cholesterol lower than in past, particle number [id number] and pattern A start zetia  consider PSK-9 inhibitor    5/1/319 note from pt seeing Dr Thedore Mins echo scheduled started on crestor 5 BIW in addition to crestor - not thinking psk-9 right now  09/07/17 cbc nl cmp glu 102 k 4.3 cr 0.7 a1c 5.7 tsh 1.2 ck 90 chol 228/62/136/164 not on crestor or zetia  same medication fax lab to dr IP  singh has appt this week  09/16/17 echo Dr Thedore Mins bp 135/90 nl asking about need for stress test   12/28/17 Dr IP singh noted lft nl chol 210/83/110/84 direct LDL 118  02/21/18 phone call flew in last week very busy ankles so swollen weight watchers   BP ok at [facility] no problem more salt this week- did not take aldactizide with her   Cough persists 02/28/18 add clarithromycin  03/04/18 cmp glu 105 sodium 134 k 4.5 ANA 1:80 homogeneous crp 5.8 mag 1.8 a1c 6 RF <14 tsh 1.66 messaged pt- go back to losartan and HCT for next refill spoke to pt   Had cholesterol with dr Thedore Mins   Cough is better on biaxin feeling well     DATA   02/27/18 CXR mild compression deformities mid thoracic spine no pulmonary disease- dexa ordered  02/27/18 pelvic US 4cm ovarian cyst smal fibroids 3cm repeat 1 year  03/03/18 dexa -1.4 s -1.3 h repeat 2-3 years   03/31/18 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  I have recommended the following interventions: dietary management education, guidance, and counseling.       I attest that current meds have been reviewed and are accurate.      Follow-up and Dispositions    ?? Return in about 6 months (around 08/27/2018) for annual wellness.         Counseling >50% time spent this visit      Encounter Diagnoses     ICD-10-CM ICD-9-CM   1. Leg edema R60.0 782.3   2. Pelvic pain R10.2 [id number]   3. Type 2 diabetes mellitus without complication, without long-term current use of insulin (HCC) E11.9 250.00   4. Hypothyroidism due to Hashimoto's thyroiditis E03.8 244.8    E06.3 245.2   5. Chronic pain of both ankles M25.571 719.47    G89.29 338.29    M25.572    6. Physical deconditioning R53.81 799.3    7. Tachycardia, unspecified R00.0 785.0     Orders Placed This Encounter   ??? US TRANSVAGINAL     Standing Status:   Future     Standing Expiration Date:   03/29/2019     Order Specific Question:   Reason for Exam     Answer:   pelvic pain   ??? ANTINUCLEAR ANTIBODIES, IFA   ??? C REACTIVE PROTEIN, QT   ??? MAGNESIUM   ??? METABOLIC PANEL, COMPREHENSIVE   ??? HEMOGLOBIN A1C W/O EAG   ??? RHEUMATOID FACTOR, QL   ??? TSH 3RD GENERATION   ??? REFERRAL TO  PHYSICAL THERAPY     Referral Priority:   Routine     Referral Type:   PT/OT/ST     Referral Reason:   Specialty Services Required       Sincerely,    Edwin Cap, MD  Note Signed Electronically

## 2018-02-25 NOTE — Patient Instructions (Addendum)
Check lab next week    Ultrasound at [facility]    Magnesium glycinate 100 mg 1-2 x day for cramps    Quercetin 250-500 mg once a day with food for sinus and cough in addition to flovent      If ankles are better next week decrease water pill to 3 x week     Physical therapy

## 2018-02-25 NOTE — Telephone Encounter (Signed)
Please put her in for an EKG at 9am-  I spoke to her

## 2018-02-26 ENCOUNTER — Encounter

## 2018-02-26 ENCOUNTER — Other Ambulatory Visit: Admit: 2018-02-26 | Discharge: 2018-02-26 | Payer: MEDICARE | Primary: Internal Medicine

## 2018-02-26 DIAGNOSIS — R000 Tachycardia, unspecified: Secondary | ICD-10-CM

## 2018-02-27 ENCOUNTER — Inpatient Hospital Stay: Admit: 2018-02-27 | Payer: MEDICARE | Primary: Internal Medicine

## 2018-02-27 DIAGNOSIS — R05 Cough: Secondary | ICD-10-CM

## 2018-02-28 ENCOUNTER — Telehealth

## 2018-02-28 MED ORDER — CLARITHROMYCIN 500 MG TAB
500 mg | ORAL_TABLET | Freq: Two times a day (BID) | ORAL | 0 refills | Status: DC
Start: 2018-02-28 — End: 2018-07-30

## 2018-02-28 NOTE — Telephone Encounter (Signed)
Please mail bone density script to pt  I spoke to her

## 2018-02-28 NOTE — Telephone Encounter (Signed)
Mailed script as ordered

## 2018-03-03 ENCOUNTER — Inpatient Hospital Stay: Admit: 2018-03-03 | Payer: MEDICARE | Attending: Internal Medicine | Primary: Internal Medicine

## 2018-03-03 DIAGNOSIS — M8589 Other specified disorders of bone density and structure, multiple sites: Secondary | ICD-10-CM

## 2018-03-03 DIAGNOSIS — R102 Pelvic and perineal pain: Secondary | ICD-10-CM

## 2018-03-03 LAB — METABOLIC PANEL, COMPREHENSIVE
ALB/GLOBRATIO: 1.7 (calc) (ref 1.0–2.5)
ALT (SGPT): 19 U/L (ref 6–29)
AST (SGOT): 18 U/L (ref 10–35)
Albumin: 4.2 g/dL (ref 3.6–5.1)
Alk. phosphatase: 80 U/L (ref 33–130)
BUN: 19 mg/dL (ref 7–25)
Bilirubin, total: 1 mg/dL (ref 0.2–1.2)
CO2: 29 mmol/L (ref 20–32)
Calcium: 10 mg/dL (ref 8.6–10.4)
Chloride: 98 mmol/L (ref 98–110)
Creatinine: 0.72 mg/dL (ref 0.60–0.93)
EGFR NON AFR AMERICAN: 81 mL/min/{1.73_m2} (ref >=60)
GFR est AA: 94 mL/min/{1.73_m2} (ref >=60)
Globulin: 2.5 g/dL (calc) (ref 1.9–3.7)
Glucose: 105 mg/dL — ABNORMAL HIGH (ref 65–99)
Potassium: 4.5 mmol/L (ref 3.5–5.3)
Protein, total: 6.7 g/dL (ref 6.1–8.1)
Sodium: 134 mmol/L — ABNORMAL LOW (ref 135–146)

## 2018-03-03 LAB — TSH 3RD GENERATION
TSH: 1.66 mIU/L (ref 0.40–4.50)
TSH: 1.66 mIU/L (ref 0.40–4.50)

## 2018-03-03 LAB — ANTINUCLEAR ANTIBODIES, IFA
ANA SCREEN, IFA: POSITIVE — AB
ANA SCREEN, IFA: POSITIVE — AB

## 2018-03-03 LAB — C REACTIVE PROTEIN, QT: C-REACTIVE PROTEIN: 5.8 mg/L (ref <8.0)

## 2018-03-03 LAB — HM DEXA SCAN
Amb DEXA, External: -1.1
BONE DENSITY, EXTERNAL: -1.1

## 2018-03-03 LAB — MAGNESIUM
Magnesium: 1.8 mg/dL (ref 1.5–2.5)
Magnesium: 1.8 mg/dL (ref 1.5–2.5)

## 2018-03-03 LAB — HEMOGLOBIN A1C W/O EAG
Hemoglobin A1C: 6 % of total Hgb — ABNORMAL HIGH (ref <5.7)
Hemoglobin A1c: 6 % of total Hgb — ABNORMAL HIGH (ref <5.7)

## 2018-03-03 LAB — ANA, TITER AND PATTERN
ANA TITER 1: 1:80 {titer} — ABNORMAL HIGH
ANA TITER 1: 1:80 {titer} — ABNORMAL HIGH

## 2018-03-03 LAB — RHEUMATOID FACTOR, QL: Rheumatoid factor: <14 IU/mL (ref <14)

## 2018-03-03 LAB — COMPREHENSIVE METABOLIC PANEL
ALT: 19 U/L (ref 6–29)
AST: 18 U/L (ref 10–35)
Albumin/Globulin Ratio: 1.7 (calc) (ref 1.0–2.5)
Albumin: 4.2 g/dL (ref 3.6–5.1)
Alkaline Phosphatase: 80 U/L (ref 33–130)
BUN: 19 mg/dL (ref 7–25)
CO2: 29 mmol/L (ref 20–32)
Calcium: 10 mg/dL (ref 8.6–10.4)
Chloride: 98 mmol/L (ref 98–110)
Creatinine: 0.72 mg/dL (ref 0.60–0.93)
EGFR IF NonAfrican American: 81 mL/min/{1.73_m2} (ref >=60)
GFR African American: 94 mL/min/{1.73_m2} (ref >=60)
GLUCOSE, FASTING,GF: 105 mg/dL — ABNORMAL HIGH (ref 65–99)
Globulin: 2.5 g/dL (calc) (ref 1.9–3.7)
Potassium: 4.5 mmol/L (ref 3.5–5.3)
Sodium: 134 mmol/L — ABNORMAL LOW (ref 135–146)
Total Bilirubin: 1 mg/dL (ref 0.2–1.2)
Total Protein: 6.7 g/dL (ref 6.1–8.1)

## 2018-03-03 LAB — RHEUMATOID FACTOR, QUALITATIVE: Rheumatoid Factor: <14 IU/mL (ref <14)

## 2018-03-03 LAB — C-REACTIVE PROTEIN: C-REACTIVE PROTEIN,CRPM: 5.8 mg/L (ref <8.0)

## 2018-03-04 NOTE — Telephone Encounter (Signed)
Patient called the office stating that she missed a call from Dr. Roger ShelterGordon and is now available.    [phone]

## 2018-03-04 NOTE — Telephone Encounter (Signed)
[facility]

## 2018-03-04 NOTE — Telephone Encounter (Signed)
Faxed.

## 2018-03-10 MED ORDER — FLOVENT HFA 110 MCG/ACTUATION AEROSOL INHALER
110 mcg/actuation | RESPIRATORY_TRACT | 1 refills | Status: AC
Start: 2018-03-10 — End: ?

## 2018-07-30 ENCOUNTER — Telehealth

## 2018-07-30 ENCOUNTER — Telehealth: Admit: 2018-07-30 | Discharge: 2018-07-30 | Payer: MEDICARE | Attending: Internal Medicine | Primary: Internal Medicine

## 2018-07-30 ENCOUNTER — Telehealth: Attending: Internal Medicine | Primary: Internal Medicine

## 2018-07-30 DIAGNOSIS — E119 Type 2 diabetes mellitus without complications: Secondary | ICD-10-CM

## 2018-07-30 NOTE — Progress Notes (Signed)
Chief Complaint   Patient presents with   ??? Cough     1940/07/10  Immunization History   Administered Date(s) Administered   ??? Influenza High Dose Vaccine PF 01/26/2015   ??? Pneumococcal Conjugate (PCV-13) 11/29/2013   ??? Pneumococcal Polysaccharide (PPSV-23) 01/25/2006   ??? Zoster Vaccine, Live 06/29/2013       HISTORIES & HABITS  Medical History:   Past Medical History:   Diagnosis Date   ??? Colon polyps    ??? Depression    ??? Diabetes mellitus (HCC)    ??? High cholesterol    ??? Hypertension    ??? Hypothyroid     hypothyroid and parathyroid adenoma   ??? Osteoarthritis     had bilateral injections hyalgan in knees   ??? Osteoporosis    ??? Overweight      Surgery History:   Past Surgical History:   Procedure Laterality Date   ??? HX BREAST BIOPSY     ??? HX COLONOSCOPY  11/2013   ??? HX HEENT      parathyroid adenoma   ??? HX HIP REPLACEMENT     ??? HX LAP CHOLECYSTECTOMY     ??? HX PARATHYROIDECTOMY       Family History:   Family History   Problem Relation Age of Onset   ??? Hypertension Mother    ??? Colon Cancer Sister      Social History     Socioeconomic History   ??? Marital status: MARRIED     Spouse name: Not on file   ??? Number of children: Not on file   ??? Years of education: Not on file   ??? Highest education level: Not on file   Tobacco Use   ??? Smoking status: Former Smoker     Packs/day: 0.10     Years: 15.00     Pack years: 1.50     Types: Cigarettes     Last attempt to quit: 06/29/2012     Years since quitting: 6.0   ??? Smokeless tobacco: Never Used   Substance and Sexual Activity   ??? Alcohol use: No     Alcohol/week: 0.0 standard drinks     Health Maintenance   Topic Date Due   ??? Foot Exam Q1  05/12/1950   ??? MICROALBUMIN Q1  05/12/1950   ??? Eye Exam Retinal or Dilated  05/12/1950   ??? DTaP/Tdap/Td series (1 - Tdap) 05/11/1962   ??? Shingrix Vaccine Age 4> (1 of 2) 05/12/1991   ??? Pneumococcal 65+ years (2 of 2 - PPSV23) 11/30/2014   ??? Medicare Yearly Exam  05/16/2018   ??? Influenza Age 49 to Adult  10/25/2018   ??? GLAUCOMA SCREENING Q2Y   02/07/2019   ??? Bone Densitometry  03/03/2020     Allergies   Allergen Reactions   ??? Statins-Hmg-Coa Reductase Inhibitors Myalgia       Current Outpatient Medications:   ???  azelastine (ASTELIN) 137 mcg (0.1 %) nasal spray, SPRAY 2 SPRAYS TWO TIMES DAILY, Disp: , Rfl:   ???  FLOVENT HFA 110 mcg/actuation inhaler, TAKE 2 PUFFS BY INHALATION EVERY TWELVE (12) HOURS., Disp: 12 Inhaler, Rfl: 1  ???  spironolactone-hydrochlorothiazide (ALDACTAZIDE) 25-25 mg per tablet, Take 1 Tab by mouth daily., Disp: 90 Tab, Rfl: 3  ???  levothyroxine (SYNTHROID) 125 mcg tablet, TAKE 1 TABLET EVERY DAY BEFORE BREAKFAST, Disp: 90 Tab, Rfl: 3  ???  losartan (COZAAR) 50 mg tablet, Take 1 Tab by mouth daily., Disp: 90 Tab, Rfl: 1  ???  ezetimibe (ZETIA) 10 mg tablet, Take  by mouth., Disp: , Rfl:   ???  multivitamin (ONE A DAY) tablet, Take 1 Tab by mouth daily., Disp: , Rfl:   ???  B.infantis-B.ani-B.long-B.bifi 10-15 mg TbEC, Take  by mouth., Disp: , Rfl:   ???  cholecalciferol (VITAMIN D3) 1,000 unit cap, Take  by mouth daily., Disp: , Rfl:   ???  cyanocobalamin (VITAMIN B-12) 1,000 mcg sublingual tablet, Take 1,000 mcg by mouth daily., Disp: , Rfl:       There were no vitals taken for this visit.    Chief Complaint   Patient presents with   ??? Cough     HPI: telemed  Melissa Mclean has h/o parathyroid adenoma, hypothyroidism, calcium score 730, overweight, high cholesterol, prediabetes and B12 deficiency now dedicated to weight loss. Going to [employer].  Tried crestor 2x week could not tolerate- sees IP Thedore Mins- nuclear stress test and echo 7/19 normal. Saw Dr Thedore Mins 10/19   Swollen ankles when flew from [place] 1 week ago did not bring HCTZ- when called a few days ago thought it was aldactizide sent into pharmacy 2 days ago   Since starting aldactizide ankles are going down significantly. 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  07/29/18 Patient was read their rights and verbal consent was received during COVID quarantine. Conducted on haiku video portal.  Came down in January was very sick for 2 weeks treated with 7 medications lung Xray nebulizer prednisone. Followed up with pulmonary in [place] stated on azelastine stopped the coughing. Asking for COVID testing high risk age.   Taking precautions very careful- only seeing son and daughterin law-   COVID supplements reviewed- D, multi A Zinc and extra zinc   and C 60 mg elderberry and c 500 zinc 7.5  Diet reviewed   Will buy BP cuff and sugar monitor- will check am and after eating alternating.  Will order sars igg when calls back local lab- other labs when quarantine lifts  Weighing herself stable cooking 3 meals a day.  243 weight lights 5 minutes -10 3 x day   Does not need nebulazer- 2 spots on lung repeat xr 11/20    Review of System  Annual: 05/15/17  General: denies fatigue, sleep problems, weight  Loss 9#  Eyes: denies eye pain, change in vision, blurring  ENT: denies nosebleeds, sore throat, sinus  Cardiovascular: denies chest pains, palpitations, dyspnea on exertion, + ankle edema bilateral - unable to tolerate statin BIW 06/27/17 Calcium score 730  particle number [id number] and pattern A on zetia seeing IP Singh stress test 7/19  5/1/319 note from pt seeing Dr Esmond Camper Thedore Mins echo nl 5/19 cant toletate  statins  even twice a week not thinking psk-9 right now; 7/19 nuclear stress test this week  Respiratory: no cough no wheeze- bronchospasm in past usually after bronchitis   Bad bronchitis 3/20 ?COVID  03/31/18 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   Gastrointestinal: denies abdominal pain, change in bowel habits, GERD  GU: denies UTI history, nocturia- 12/19 pelvic US 4 cm right ovarian cyst, 2 fibroids 3cm uterus repeat 1 year  Musculoskeletal: denies back pain, joint pain- knees improved with diet- broken toe 4th right foot 1/19   Skin: denies rash, change in moles, excema  Neuro: nl ambulation, cognitive nl  Psychiatric: No memory change, mood stable  HemeLymphatic: denies abnormal bruising, bleeding, enlarged lymph nodes, anemia  Allergic/Immunologic: denies persistent infections, HIV exposure, hay fever  +ANA 1:80  Exercise:walking   Diet: healthy vegetables chicken eggs some bread and pasta  Reports varicella  ??  Physical Exam  General: well nourished, well hydrated, no acute distress  Head: normocephalic  Eye: conjunctivae and lids normal  ZOX:WRUE Cavity: no lesion, pharynx normal  Neck: supple, no masses, no lymphadenopathy  Respiratory: no rales, rhonchi, wheeze  Cardiovascular: RRR, S1 and S2 normal, no murmur ST +edema ankles only neo erythema   Breast: deferred  Peripheral circulation: no cyanosis, clubbing, edema + spider veins  Gastrointestinal: NTND no HSM  Skin: no rashes, change in moles  Neurologic: non focal vibration intact distal  Cranial nerves: II - XII grossly intact  AVW:UJWJXBJYN and insight: intact  WGN:FAOZ and neck: normal alignment and mobility  Digit and nail: no clubbing, cyanosis, petechiae, or nodes  Spine, rib, pelvis: normal alignment, no deformity  Gait and station: normal- right 4th toe slight increase  ????  Assessment-Plan: follow up- telemed in [place]  -Recent bronchitis on several medications and inhalers has pulmonary doctor in [place] saw Dr. Emelda Fear in [place].   Asking for SARS-CoV-2 testing IgG.  We will get the name of the lab and order at that time.  Had 2 small spots on lungs unlikely to be a problem we will repeat chest x-ray 11/20  - BP on meds-ordering BP cuff from pharmacy  -A1c 6 off meds recommend diet and exercise strategies will order glucose monitor with strips-check sometimes fasting and sometimes after meals to advise on what to eat.  -Overweight prediabetes last A1c 6.0 not on meds-consider intermittent fasting for weight loss continue low-carb diet.  Increase light weights 5 to 10 minutes in chair several times a day while watching TV, swimming and walking.  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  DATA 11/16  chol 252/58/163/157 a1c 6.1 tsh 1.2 cmp nl   06/13/15 ldl particle 2049 ldl 164 chol 256/59/164/163 tsh 2.0 a1c 5.9  12/06/15 cmp nl chol 259/55/164/198 tsh 2.3 PTH 27 a1c 6.0  08/03/16 cmp nl chol 242/66/145/168 tsh 1.4 a1c 5.8 d 43 increase b12 continue weight loss offer bydureon pen to pt  12/21/16 note Dr Allena Katz colonoscopy unalbe to advance scope likely order cologuard; BE if needed  01/14/17 cbc nl cmp nl tsh 2 a1c 5.7 pth 26 b12  368 chol 273/72/165/199   Spoke to pt - took lab after cruise, add b12 500 BIW recheck cholesterol with improved diet  Had colonoscopy couldn't go through scheduled barium enema  05/14/17 cmp nl glu 108 a1c 5.8 b12 809 tsh 1.83 chol 217/69/127/107 ratio 3.1 ldl particle 1399 size nl pattern A peak size normal apoB 100 lpa-152 hscrp 1.6 LP-PLA2 103 message left still recommend calcium score for confirmation that medication not necessary  06/27/17 refer to cardiology Calcium score 730 cant take statins losing weight cholesterol lower than in past, particle number 1399 and pattern A start zetia  consider PSK-9 inhibitor   5/1/319 note from pt seeing Dr Thedore Mins echo scheduled started on crestor 5 BIW in addition to crestor - not thinking psk-9 right now  09/07/17 cbc nl cmp glu 102 k 4.3 cr 0.7 a1c 5.7 tsh 1.2 ck 90 chol 228/62/136/164 not on crestor or zetia  same medication fax lab to dr IP  singh has appt this week  09/16/17 echo Dr Thedore Mins bp 135/90 nl asking about need for stress test   12/28/17 Dr IP singh noted lft nl chol 210/83/110/84 direct LDL 118  02/21/18 phone call flew in last week very busy ankles so swollen weight watchers   [facility] no [facility] more salt this week- did not take aldactizide with her   Cough persists 02/28/18 add clarithromycin  03/04/18 cmp glu 105 sodium 134 k 4.5 ANA 1:80 homogeneous crp 5.8 mag 1.8 a1c 6 RF <14 tsh 1.66 messaged pt- go back to losartan and HCT for next refill spoke to pt   Had cholesterol with dr Thedore Mins   Cough is better on biaxin feeling well     DATA   02/27/18 CXR mild compression deformities mid thoracic spine no pulmonary disease- dexa ordered  02/27/18 pelvic US 4cm ovarian cyst smal fibroids 3cm repeat 1 year  03/03/18 dexa -1.4 s -1.3 h repeat 2-3 years   03/31/18 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  I have recommended the following interventions: dietary management education, guidance, and counseling.       I attest  that current meds have been reviewed and are accurate.          Counseling >50% time spent this visit      Encounter Diagnoses     ICD-10-CM ICD-9-CM   1. Type 2 diabetes mellitus without complication, without long-term current use of insulin (HCC) E11.9 250.00   2. Hypothyroidism due to Hashimoto's thyroiditis E03.8 244.8    E06.3 245.2   3. Elevated fasting glucose R73.01 790.21   4. Essential hypertension I10 401.9   5. Tachycardia, unspecified R00.0 785.0   6. H/O parathyroidectomy Z90.09 V15.29   7. Screening for viral disease Z11.59 V73.99     Orders Placed This Encounter   ??? SARS-COV-2 AB, IGG ([facility])   ??? azelastine (ASTELIN) 137 mcg (0.1 %) nasal spray  Sig: SPRAY 2 SPRAYS TWO TIMES DAILY       Sincerely,    Edwin Cap, MD  Note Signed Electronically

## 2018-07-30 NOTE — Patient Instructions (Signed)
Planning to get BP cuff and glucose monitor will advise with numbers  Check glucose not just fasting in the morning but also other days 2 hours after eating

## 2018-07-30 NOTE — Telephone Encounter (Signed)
Please order glucose monitor for Melissa Mclean with test strips and lancets diagnosis E 11.9 testing 1 time a day.  She is in [place] the pharmacy [facility] on [address] is the one she uses.

## 2018-07-30 NOTE — Telephone Encounter (Signed)
Patient called back stating that she will be getting COVID antibody testing done, she contacted [facility] in [place]. All she got was a recording, I offered we mail her the lab script.    Address    [facility]  [address]  [place], [place] [address]

## 2018-07-30 NOTE — Telephone Encounter (Signed)
mailed

## 2018-07-30 NOTE — Telephone Encounter (Signed)
please mail covid igg to patient    Melissa Mclean  [address]  [place], [place] [address]

## 2018-07-30 NOTE — Progress Notes (Signed)
Chief Complaint   Patient presents with   ??? Cough     [birth year]  Immunization History   Administered Date(s) Administered   ??? Influenza High Dose Vaccine PF 01/26/2015   ??? Pneumococcal Conjugate (PCV-13) 11/29/2013   ??? Pneumococcal Polysaccharide (PPSV-23) 01/25/2006   ??? Zoster Vaccine, Live 06/29/2013       HISTORIES & HABITS  Medical History:   Past Medical History:   Diagnosis Date   ??? Colon polyps    ??? Depression    ??? Diabetes mellitus (HCC)    ??? High cholesterol    ??? Hypertension    ??? Hypothyroid     hypothyroid and parathyroid adenoma   ??? Osteoarthritis     had bilateral injections hyalgan in knees   ??? Osteoporosis    ??? Overweight      Surgery History:   Past Surgical History:   Procedure Laterality Date   ??? HX BREAST BIOPSY     ??? HX COLONOSCOPY  11/2013   ??? HX HEENT      parathyroid adenoma   ??? HX HIP REPLACEMENT     ??? HX LAP CHOLECYSTECTOMY     ??? HX PARATHYROIDECTOMY       Family History:   Family History   Problem Relation Age of Onset   ??? Hypertension Mother    ??? Colon Cancer Sister      Social History     Socioeconomic History   ??? Marital status: MARRIED     Spouse name: Not on file   ??? Number of children: Not on file   ??? Years of education: Not on file   ??? Highest education level: Not on file   Tobacco Use   ??? Smoking status: Former Smoker     Packs/day: 0.10     Years: 15.00     Pack years: 1.50     Types: Cigarettes     Last attempt to quit: 06/29/2012     Years since quitting: 6.0   ??? Smokeless tobacco: Never Used   Substance and Sexual Activity   ??? Alcohol use: No     Alcohol/week: 0.0 standard drinks     Health Maintenance   Topic Date Due   ??? Foot Exam Q1  05/12/1950   ??? MICROALBUMIN Q1  05/12/1950   ??? Eye Exam Retinal or Dilated  05/12/1950   ??? DTaP/Tdap/Td series (1 - Tdap) 05/11/1962   ??? Shingrix Vaccine Age 77> (1 of 2) 05/12/1991   ??? Pneumococcal 65+ years (2 of 2 - PPSV23) 11/30/2014   ??? Medicare Yearly Exam  05/16/2018   ??? Influenza Age 77 to Adult  10/25/2018    ??? GLAUCOMA SCREENING Q2Y  02/07/2019   ??? Bone Densitometry  03/03/2020     Allergies   Allergen Reactions   ??? Statins-Hmg-Coa Reductase Inhibitors Myalgia       Current Outpatient Medications:   ???  azelastine (ASTELIN) 137 mcg (0.1 %) nasal spray, SPRAY 2 SPRAYS TWO TIMES DAILY, Disp: , Rfl:   ???  FLOVENT HFA 110 mcg/actuation inhaler, TAKE 2 PUFFS BY INHALATION EVERY TWELVE (12) HOURS., Disp: 12 Inhaler, Rfl: 1  ???  spironolactone-hydrochlorothiazide (ALDACTAZIDE) 25-25 mg per tablet, Take 1 Tab by mouth daily., Disp: 90 Tab, Rfl: 3  ???  levothyroxine (SYNTHROID) 125 mcg tablet, TAKE 1 TABLET EVERY DAY BEFORE BREAKFAST, Disp: 90 Tab, Rfl: 3  ???  losartan (COZAAR) 50 mg tablet, Take 1 Tab by mouth daily., Disp: 90 Tab, Rfl: 1  ???  ezetimibe (ZETIA) 10 mg tablet, Take  by mouth., Disp: , Rfl:   ???  multivitamin (ONE A DAY) tablet, Take 1 Tab by mouth daily., Disp: , Rfl:   ???  B.infantis-B.ani-B.long-B.bifi 10-15 mg TbEC, Take  by mouth., Disp: , Rfl:   ???  cholecalciferol (VITAMIN D3) 1,000 unit cap, Take  by mouth daily., Disp: , Rfl:   ???  cyanocobalamin (VITAMIN B-12) 1,000 mcg sublingual tablet, Take 1,000 mcg by mouth daily., Disp: , Rfl:       There were no vitals taken for this visit.    Chief Complaint   Patient presents with   ??? Cough     HPI: telemed  Melissa Mclean has h/o parathyroid adenoma, hypothyroidism, calcium score 730, overweight, high cholesterol, prediabetes and B12 deficiency now dedicated to weight loss. Going to [employer].  Tried crestor 2x week could not tolerate- sees IP Thedore Mins- nuclear stress test and echo 7/19 normal. Saw Dr Thedore Mins 10/19   Swollen ankles when flew from [place] 1 week ago did not bring HCTZ- when called a few days ago thought it was aldactizide sent into pharmacy 2 days ago   Since starting aldactizide ankles are going down significantly. 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  07/29/18 Patient was read their rights and verbal consent was received during COVID quarantine. Conducted on haiku video portal.  Came down in January was very sick for 2 weeks treated with 7 medications lung Xray nebulizer prednisone. Followed up with pulmonary in [place] stated on azelastine stopped the coughing. Asking for COVID testing high risk age.   Taking precautions very careful- only seeing son and daughterin law-   COVID supplements reviewed- D, multi A Zinc and extra zinc   and C 60 mg elderberry and c 500 zinc 7.5  Diet reviewed   Will buy BP cuff and sugar monitor- will check am and after eating alternating.  Will order sars igg when calls back local lab- other labs when quarantine lifts  Weighing herself stable cooking 3 meals a day.  243 weight lights 5 minutes -10 3 x day   Does not need nebulazer- 2 spots on lung repeat xr 11/20    Review of System  Annual: 05/15/17  General: denies fatigue, sleep problems, weight  Loss 9#  Eyes: denies eye pain, change in vision, blurring  ENT: denies nosebleeds, sore throat, sinus  Cardiovascular: denies chest pains, palpitations, dyspnea on exertion, + ankle edema bilateral - unable to tolerate statin BIW 06/27/17 Calcium score 730  particle number [id number] and pattern A on zetia seeing IP Singh stress test 7/19   5/1/319 note from pt seeing Dr Esmond Camper Thedore Mins echo nl 5/19 cant toletate statins  even twice a week not thinking psk-9 right now; 7/19 nuclear stress test this week  Respiratory: no cough no wheeze- bronchospasm in past usually after bronchitis   Bad bronchitis 3/20 ?COVID  03/31/18 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   Gastrointestinal: denies abdominal pain, change in bowel habits, GERD  GU: denies UTI history, nocturia- 12/19 pelvic US 4 cm right ovarian cyst, 2 fibroids 3cm uterus repeat 1 year  Musculoskeletal: denies back pain, joint pain- knees improved with diet- broken toe 4th right foot 1/19   Skin: denies rash, change in moles, excema  Neuro: nl ambulation, cognitive nl  Psychiatric: No memory change, mood stable  HemeLymphatic: denies abnormal bruising, bleeding, enlarged lymph nodes, anemia  Allergic/Immunologic: denies persistent infections, HIV exposure, hay fever  +ANA 1:80  Exercise:walking   Diet: healthy vegetables chicken eggs some bread and pasta  Reports varicella  ??  Physical Exam  General: well nourished, well hydrated, no acute distress  Head: normocephalic  Eye: conjunctivae and lids normal  YKD:XIPJ Cavity: no lesion, pharynx normal  Neck: supple, no masses, no lymphadenopathy  Respiratory: no rales, rhonchi, wheeze  Cardiovascular: RRR, S1 and S2 normal, no murmur ST +edema ankles only neo erythema   Breast: deferred  Peripheral circulation: no cyanosis, clubbing, edema + spider veins  Gastrointestinal: NTND no HSM  Skin: no rashes, change in moles  Neurologic: non focal vibration intact distal  Cranial nerves: II - XII grossly intact  ASN:KNLZJQBHA and insight: intact  LPF:XTKW and neck: normal alignment and mobility  Digit and nail: no clubbing, cyanosis, petechiae, or nodes  Spine, rib, pelvis: normal alignment, no deformity  Gait and station: normal- right 4th toe slight increase  ????  Assessment-Plan: follow up- telemed in [place]   -Recent bronchitis on several medications and inhalers has pulmonary doctor in [place] saw Dr. Emelda Fear in [place].  Asking for SARS-CoV-2 testing IgG.  We will get the name of the lab and order at that time.  Had 2 small spots on lungs unlikely to be a problem we will repeat chest x-ray 11/20  - BP on meds-ordering BP cuff from pharmacy  -A1c 6 off meds recommend diet and exercise strategies will order glucose monitor with strips-check sometimes fasting and sometimes after meals to advise on what to eat.  -Overweight prediabetes last A1c 6.0 not on meds-consider intermittent fasting for weight loss continue low-carb diet.  Increase light weights 5 to 10 minutes in chair several times a day while watching TV, swimming and walking.  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  DATA 11/16  chol 252/58/163/157 a1c 6.1 tsh 1.2 cmp nl   06/13/15 ldl particle 2049 ldl 164 chol 256/59/164/163 tsh 2.0 a1c 5.9  12/06/15 cmp nl chol 259/55/164/198 tsh 2.3 PTH 27 a1c 6.0  08/03/16 cmp nl chol 242/66/145/168 tsh 1.4 a1c 5.8 d 43 increase b12 continue weight loss offer bydureon pen to pt   12/21/16 note Dr Allena KatzPatel colonoscopy unalbe to advance scope likely order cologuard; BE if needed  01/14/17 cbc nl cmp nl tsh 2 a1c 5.7 pth 26 b12 368 chol 273/72/165/199   Spoke to pt - took lab after cruise, add b12 500 BIW recheck cholesterol with improved diet  Had colonoscopy couldn't go through scheduled barium enema  05/14/17 cmp nl glu 108 a1c 5.8 b12 809 tsh 1.83 chol 217/69/127/107 ratio 3.1 ldl particle 1399 size nl pattern A peak size normal apoB 100 lpa-152 hscrp 1.6 LP-PLA2 103 message left still recommend calcium score for confirmation that medication not necessary  06/27/17 refer to cardiology Calcium score 730 cant take statins losing weight cholesterol lower than in past, particle number 1399 and pattern A start zetia  consider PSK-9 inhibitor   5/1/319 note from pt seeing Dr Thedore MinsSingh echo scheduled started on crestor 5 BIW in addition to crestor - not thinking psk-9 right now  09/07/17 cbc nl cmp glu 102 k 4.3 cr 0.7 a1c 5.7 tsh 1.2 ck 90 chol 228/62/136/164 not on crestor or zetia  same medication fax lab to dr IP  singh has appt this week  09/16/17 echo Dr Thedore MinsSingh bp 135/90 nl asking about need for stress test   12/28/17 Dr IP singh noted lft nl chol 210/83/110/84 direct LDL 118  02/21/18 phone call flew in last week very busy ankles so swollen weight watchers   [facility] no [facility] more salt this week- did not take aldactizide with her   Cough persists 02/28/18 add clarithromycin  03/04/18 cmp glu 105 sodium 134 k 4.5 ANA 1:80 homogeneous crp 5.8 mag 1.8 a1c 6 RF <14 tsh 1.66 messaged pt- go back to losartan and HCT for next refill spoke to pt   Had cholesterol with dr Thedore Minssingh   Cough is better on biaxin feeling well     DATA   02/27/18 CXR mild compression deformities mid thoracic spine no pulmonary disease- dexa ordered  02/27/18 pelvic US 4cm ovarian cyst smal fibroids 3cm repeat 1 year  03/03/18 dexa -1.4 s -1.3 h repeat 2-3 years   03/31/18 dr Emelda Fearferguson noted PFT restrictive lung disease      Discussed the patient's BMI with her.  I have recommended the following interventions: dietary management education, guidance, and counseling.       I attest that current meds have been reviewed and are accurate.          Counseling >50% time spent this visit      Encounter Diagnoses     ICD-10-CM ICD-9-CM   1. Type 2 diabetes mellitus without complication, without long-term current use of insulin (HCC) E11.9 250.00   2. Hypothyroidism due to Hashimoto's thyroiditis E03.8 244.8    E06.3 245.2   3. Elevated fasting glucose R73.01 790.21   4. Essential hypertension I10 401.9   5. Tachycardia, unspecified R00.0 785.0   6. H/O parathyroidectomy Z90.09 V15.29   7. Screening for viral disease Z11.59 V73.99     Orders Placed This Encounter   ??? SARS-COV-2 AB, IGG ([facility] Default)   ??? azelastine (ASTELIN) 137 mcg (0.1 %) nasal spray  Sig: SPRAY 2 SPRAYS TWO TIMES DAILY       Sincerely,    Edwin Cap, MD  Note Signed Electronically

## 2018-08-04 NOTE — Addendum Note (Signed)
Addendum  Note by Gwinda Passe at 08/04/18 1001                Author: Gwinda Passe  Service: --  Author Type: Medical Assistant       Filed: 08/04/18 1001  Encounter Date: 07/30/2018  Status: Signed          Editor: Gwinda Passe (Medical Assistant)          Addended by: Gwinda Passe on: 08/04/2018 10:01 AM    Modules accepted: Orders

## 2018-08-04 NOTE — Addendum Note (Signed)
Addended by: Gwinda Passe on: 08/04/2018 10:01 AM     Modules accepted: Orders

## 2018-08-04 NOTE — Telephone Encounter (Signed)
RX for glucose monitor with test strips and lancets diagnosis [facility].  Patient informed

## 2018-08-12 MED ORDER — LOSARTAN 50 MG TAB
50 mg | ORAL_TABLET | ORAL | 1 refills | Status: AC
Start: 2018-08-12 — End: ?

## 2018-08-12 MED ORDER — LOSARTAN 50 MG TAB
50 mg | ORAL_TABLET | Freq: Every day | ORAL | 1 refills | Status: AC
Start: 2018-08-12 — End: ?

## 2018-08-12 NOTE — Telephone Encounter (Signed)
rx sent

## 2018-08-12 NOTE — Telephone Encounter (Signed)
PT called looking for refill of losartan [facility] to [facility] [place] [address] [place]

## 2018-09-10 LAB — SARS-COV-2 AB, IGG: SARS-CoV-2 Ab, IgG: NEGATIVE

## 2018-09-10 LAB — COVID-19, ANTIBODY, IGG: Sars-Cov-2 Antibody, IgG: NEGATIVE

## 2018-09-16 NOTE — Telephone Encounter (Signed)
Results negative  lmom

## 2018-09-16 NOTE — Telephone Encounter (Signed)
Patient called asking for a call back with her COVID antibody testing results.    Pt can be reached at [phone]

## 2018-09-17 NOTE — Telephone Encounter (Signed)
Pt made aware results negative  To md fyi

## 2018-09-25 ENCOUNTER — Telehealth: Admit: 2018-09-25 | Discharge: 2018-09-25 | Payer: MEDICARE | Attending: Internal Medicine | Primary: Internal Medicine

## 2018-09-25 ENCOUNTER — Telehealth: Attending: Internal Medicine | Primary: Internal Medicine

## 2018-09-25 DIAGNOSIS — R5383 Other fatigue: Secondary | ICD-10-CM

## 2018-09-25 NOTE — Progress Notes (Signed)
Chief Complaint   Patient presents with   ??? Fatigue     1940/09/21  Immunization History   Administered Date(s) Administered   ??? Influenza High Dose Vaccine PF 01/26/2015   ??? Pneumococcal Conjugate (PCV-13) 11/29/2013   ??? Pneumococcal Polysaccharide (PPSV-23) 01/25/2006   ??? Zoster Vaccine, Live 06/29/2013       HISTORIES & HABITS  Medical History:   Past Medical History:   Diagnosis Date   ??? Colon polyps    ??? Depression    ??? Diabetes mellitus (HCC)    ??? High cholesterol    ??? Hypertension    ??? Hypothyroid     hypothyroid and parathyroid adenoma   ??? Osteoarthritis     had bilateral injections hyalgan in knees   ??? Osteoporosis    ??? Overweight      Surgery History:   Past Surgical History:   Procedure Laterality Date   ??? HX BREAST BIOPSY     ??? HX COLONOSCOPY  11/2013   ??? HX HEENT      parathyroid adenoma   ??? HX HIP REPLACEMENT     ??? HX LAP CHOLECYSTECTOMY     ??? HX PARATHYROIDECTOMY       Family History:   Family History   Problem Relation Age of Onset   ??? Hypertension Mother    ??? Colon Cancer Sister      Social History     Socioeconomic History   ??? Marital status: WIDOWED     Spouse name: Not on file   ??? Number of children: Not on file   ??? Years of education: Not on file   ??? Highest education level: Not on file   Tobacco Use   ??? Smoking status: Former Smoker     Packs/day: 0.10     Years: 15.00     Pack years: 1.50     Types: Cigarettes     Last attempt to quit: 06/29/2012     Years since quitting: 6.2   ??? Smokeless tobacco: Never Used   Substance and Sexual Activity   ??? Alcohol use: No     Alcohol/week: 0.0 standard drinks     Health Maintenance   Topic Date Due   ??? Foot Exam Q1  05/12/1950   ??? MICROALBUMIN Q1  05/12/1950   ??? Eye Exam Retinal or Dilated  05/12/1950   ??? DTaP/Tdap/Td series (1 - Tdap) 05/11/1962   ??? Shingrix Vaccine Age 70> (1 of 2) 05/12/1991   ??? Pneumococcal 65+ years (2 of 2 - PPSV23) 11/30/2014   ??? Medicare Yearly Exam  05/16/2018   ??? Influenza Age 33 to Adult  10/25/2018   ??? GLAUCOMA SCREENING Q2Y   02/07/2019   ??? Bone Densitometry  03/03/2020     Allergies   Allergen Reactions   ??? Statins-Hmg-Coa Reductase Inhibitors Myalgia       Current Outpatient Medications:   ???  losartan (COZAAR) 50 mg tablet, TAKE 1 TABLET BY MOUTH EVERY DAY, Disp: 90 Tab, Rfl: 1  ???  losartan (COZAAR) 50 mg tablet, Take 1 Tab by mouth daily., Disp: 90 Tab, Rfl: 1  ???  azelastine (ASTELIN) 137 mcg (0.1 %) nasal spray, SPRAY 2 SPRAYS TWO TIMES DAILY, Disp: , Rfl:   ???  FLOVENT HFA 110 mcg/actuation inhaler, TAKE 2 PUFFS BY INHALATION EVERY TWELVE (12) HOURS., Disp: 12 Inhaler, Rfl: 1  ???  spironolactone-hydrochlorothiazide (ALDACTAZIDE) 25-25 mg per tablet, Take 1 Tab by mouth daily., Disp: 90 Tab, Rfl: 3  ???  levothyroxine (SYNTHROID) 125 mcg tablet, TAKE 1 TABLET EVERY DAY BEFORE BREAKFAST, Disp: 90 Tab, Rfl: 3  ???  ezetimibe (ZETIA) 10 mg tablet, Take  by mouth., Disp: , Rfl:   ???  multivitamin (ONE A DAY) tablet, Take 1 Tab by mouth daily., Disp: , Rfl:   ???  B.infantis-B.ani-B.long-B.bifi 10-15 mg TbEC, Take  by mouth., Disp: , Rfl:   ???  cholecalciferol (VITAMIN D3) 1,000 unit cap, Take  by mouth daily., Disp: , Rfl:   ???  cyanocobalamin (VITAMIN B-12) 1,000 mcg sublingual tablet, Take 1,000 mcg by mouth daily., Disp: , Rfl:       There were no vitals taken for this visit.    Chief Complaint   Patient presents with   ??? Fatigue     HPI: telemed  Melissa Mclean has h/o parathyroid adenoma, hypothyroidism, calcium score 730, overweight, high cholesterol, prediabetes and B12 deficiency now dedicated to weight loss. Going to [employer]. Does not tolerate statins- low dose     09/25/18 Patient was read their rights and verbal consent was received during COVID quarantine. Conducted on [employer] video portal.  In [place]- came up from [place] 2 weeks ago- really tired- needs labs. Going back to [place] in 2 weeks   Fatigue no other symptoms started past few days- busy packing- no chest pain or shortness of breath no fever chills. Appetite normal. Sleeps  downstairs, packed up everything.  Coughing more- unclear if exposure  Went for a hearing test- hears whooshing in ear asking for Carotid doppler.    6/20 Doing really well   Saw nutritioninst omega 3 glycemic health raw b complex multi d 3   Deep breath is causing cough- comes and goes for several months lungs clear- using  flovent twice a day, no wheeze  Decreased hearing- has hearing aids     Weight stable   Had colonsocpy unable to advance Dr Allena KatzPatel- instead ordered  Barium enema normal- terrible experience Dr Allena KatzPatel did not follow up- scar tissue from prior polyps likely  Cologuard in 2021 instead of colonoscopy, fam hist colon ca  Taking 1/2 metformin and aldactizide 1/2 BP low no side effects  ??  07/29/18 Patient was read their rights and verbal consent was received during COVID quarantine. Conducted on [employer] video portal.  Came down in January was very sick for 2 weeks treated with 7 medications lung Xray nebulizer prednisone. Followed up with pulmonary in [place] stated on azelastine stopped the coughing. Asking for COVID testing high risk age.   Taking precautions very careful- only seeing son and daughterin law-   COVID supplements reviewed- D, multi A Zinc and extra zinc 30mg   and C 60 mg elderberry and c 500 zinc 7.5  Diet reviewed   Will buy BP cuff and sugar monitor- will check am and after eating alternating.  Will order sars igg when calls back local lab- other labs when quarantine lifts  Weighing herself stable cooking 3 meals a day.  243 weight lights 5 minutes -10 3 x day   Does not need nebulazer- 2 spots on lung repeat xr 11/20    Swollen ankles when flew from [place] 1 week ago did not bring HCTZ- when called a few days ago thought it was aldactizide sent into pharmacy 2 days ago   Since starting aldactizide ankles are going down significantly. Ankles were huge could not put normal shoes on, today wearing shoes not zipped up- ankle swelling happening too frequently  Getting ankle cramps  in feet-  drinking a lot of water, not taking magnesium  Tried crestor 2x week could not tolerate- sees IP Thedore MinsSingh- nuclear stress test and echo 7/19 normal. Saw Dr Thedore MinsSingh 10/19     Review of System  Annual: 05/15/17  General: denies  sleep problems, weight  Loss 9# + fatigue  Eyes: denies eye pain, change in vision, blurring  ENT: denies nosebleeds, sore throat, sinus  Cardiovascular: denies chest pains, palpitations, dyspnea on exertion, + ankle edema bilateral - unable to tolerate statin BIW 06/27/17 Calcium score 730  particle number [id number] and pattern A on zetia seeing IP Singh stress test 7/19  5/1/319 note from pt seeing Dr Esmond CamperIP Thedore MinsSingh echo nl 5/19 cant toletate statins even twice a week not thinking psk-9 right now; 7/19 nuclear stress test negative   09/29/18 carotid mild plaque right minimal left no sig  stenosis  Respiratory: no cough no wheeze- bronchospasm in past usually after bronchitis   Bad bronchitis 3/20 ?COVID cough persits  03/31/18 dr Emelda Fearferguson noted PFT restrictive lung disease   Gastrointestinal: denies abdominal pain, change in bowel habits, GERD  GU: denies UTI history, nocturia- 12/19 pelvic US 4 cm right ovarian cyst, 2 fibroids 3cm uterus repeat 1 year  Musculoskeletal: denies back pain, joint pain- knees improved with diet- broken toe 4th right foot 1/19   Skin: denies rash, change in moles, excema  Neuro: nl ambulation, cognitive nl  Psychiatric: No memory change, mood stable  HemeLymphatic: denies abnormal bruising, bleeding, enlarged lymph nodes, anemia  Allergic/Immunologic: denies persistent infections, HIV exposure, hay fever  +ANA 1:80  Exercise:walking   Diet: healthy vegetables chicken eggs some bread and pasta  Reports varicella  ??  Physical Exam  General: well nourished, well hydrated, no acute distress  Head: normocephalic  Eye: conjunctivae and lids normal  GNF:AOZHENT:Oral Cavity: no lesion, pharynx normal  Neck: supple, no masses, no lymphadenopathy  Respiratory: no rales, rhonchi, wheeze  Cardiovascular:  RRR, S1 and S2 normal, no murmur ST +edema ankles only neo erythema   Breast: deferred  Peripheral circulation: no cyanosis, clubbing, edema + spider veins  Gastrointestinal: NTND no HSM  Skin: no rashes, change in moles  Neurologic: non focal vibration intact distal  Cranial nerves: II - XII grossly intact  YQM:VHQIONGEXSE:Judgement and insight: intact  BMW:UXLKSK:Head and neck: normal alignment and mobility  Digit and nail: no clubbing, cyanosis, petechiae, or nodes  Spine, rib, pelvis: normal alignment, no deformity  Gait and station: normal- right 4th toe slight increase  ????  Assessment-Plan: follow up- telemed in [place]  -Fatigue since getting back from [place] and cleaning out boxes in the house.  Unclear if allergen exposure.  Cough slightly increased.  Recommend check lab.  - whooshing in ear-audiologist recommended carotid Doppler- 09/29/18 carotid mild plaque right minimal left no sig stenosis  -Recent bronchitis on several medications and inhalers has pulmonary doctor in [place] saw Dr. Emelda FearFerguson in [place].  SARS-CoV-2 testing IgG negative.   Had 2 small spots on lungs unlikely to be a problem we will repeat chest x-ray 11/20  - BP on meds-ordering BP cuff from pharmacy  -A1c 6 off meds recommend diet and exercise strategies will order glucose monitor with strips-check sometimes fasting and sometimes after meals to advise on what to eat.  -Overweight prediabetes last A1c 6.0 not on meds-consider intermittent fasting for weight loss continue low-carb diet.  Increase light weights 5 to 10 minutes in chair several times a day while watching TV, swimming and  walking.  -Start supplements reviewed patient taking  - Sinus tachycardia- no episodes at home  - edema on aldactizide TIW improved  It is not recommended to fly right now- letter written for airline  - cramps legs improve start magnesium glycinate 100mg  bid  03/31/18 dr Glo Herring noted PFT restrictive lung disease   - compression deformity on CXR_ h/o hyperparathyroidism check  dexa consider boniva or prolia if low or refer to endocrine  -  BP controlled on losartan 50 and aldactizide  - calcium score 730 follow may need PSK-9 inhibitor in future taking zetia sees IP singh  - Weight loss on weight watchers- conitnue diet reviewed  - high cholesterol   - follow tsh and intact PTH with prior history  - high cholesterol on zetia cant tolerate any statin- stress test and echo 7/19 nl  - mammogarm ordered and dexa 7/20  shingrix at pharmacy   pneumavax rec next year- pt defers    ??  DATA 11/16 chol 252/58/163/157 a1c 6.1 tsh 1.2 cmp nl   06/13/15 ldl particle 2049 ldl 164 chol 256/59/164/163 tsh 2.0 a1c 5.9  12/06/15 cmp nl chol 259/55/164/198 tsh 2.3 PTH 27 a1c 6.0  08/03/16 cmp nl chol 242/66/145/168 tsh 1.4 a1c 5.8 d 43 increase b12 continue weight loss offer bydureon pen to pt  12/21/16 note Dr Posey Pronto colonoscopy unalbe to advance scope likely order cologuard; BE if needed  01/14/17 cbc nl cmp nl tsh 2 a1c 5.7 pth 26 b12 368 chol 273/72/165/199   Spoke to pt - took lab after cruise, add b12 500 BIW recheck cholesterol with improved diet  Had colonoscopy couldn't go through scheduled barium enema  05/14/17 cmp nl glu 108 a1c 5.8 b12 809 tsh 1.83 chol 217/69/127/107 ratio 3.1 ldl particle 1399 size nl pattern A peak size normal apoB 100 lpa-152 hscrp 1.6 LP-PLA2 103 message left still recommend calcium score for confirmation that medication not necessary  06/27/17 refer to cardiology Calcium score 730 cant take statins losing weight cholesterol lower than in past, particle number [id number] and pattern A start zetia  consider PSK-9 inhibitor   5/1/319 note from pt seeing Dr Candiss Norse echo scheduled started on crestor 5 BIW in addition to crestor - not thinking psk-9 right now  09/07/17 cbc nl cmp glu 102 k 4.3 cr 0.7 a1c 5.7 tsh 1.2 ck 90 chol 228/62/136/164 not on crestor or zetia  same medication fax lab to dr IP  singh has appt this week  09/16/17 echo Dr Candiss Norse bp 135/90 nl asking about need for stress test    12/28/17 Dr IP singh noted lft nl chol 210/83/110/84 direct LDL 118  02/21/18 phone call flew in last week very busy ankles so swollen weight watchers   BP ok at [facility] no problem more salt this week- did not take aldactizide with her   Cough persists 02/28/18 add clarithromycin  03/04/18 cmp glu 105 sodium 134 k 4.5 ANA 1:80 homogeneous crp 5.8 mag 1.8 a1c 6 RF <14 tsh 1.66 messaged pt- go back to losartan and HCT for next refill spoke to pt   Had cholesterol with dr Candiss Norse   Cough is better on biaxin feeling well   02/27/18 CXR mild compression deformities mid thoracic spine no pulmonary disease- dexa ordered  02/27/18 pelvic US 4cm ovarian cyst smal fibroids 3cm repeat 1 year  03/03/18 dexa -1.4 s -1.3 h repeat 2-3 years   03/31/18 dr Glo Herring noted PFT restrictive lung disease   09/25/18 covid igg neg  09/29/18 carotid mild plaque right minimal left no sig  stenosis  Cbc 1 immature gran cmp glu 107 chol 224/61/119/220 B12 785 crp 2 tsh 0.8 d 34 a1c 6.3     Discussed the patient's BMI with her.  I have recommended the following interventions: dietary management education, guidance, and counseling.       I attest that current meds have been reviewed and are accurate.          Counseling >50% time spent this visit      Encounter Diagnoses     ICD-10-CM ICD-9-CM   1. Fatigue, unspecified type R53.83 780.79   2. Agatston CAC score, >400 R93.1 793.2   3. Abnormal sensation in ear, left H93.8X2 388.8   4. Other specified symptoms and signs involving the circulatory and respiratory systems  R09.89 785.9     786.9   5. Hypothyroidism due to Hashimoto's thyroiditis E03.8 244.8    E06.3 245.2   6. Type 2 diabetes mellitus without complication, without long-term current use of insulin (HCC) E11.9 250.00   7. High cholesterol E78.00 272.0   8. B12 deficiency E53.8 266.2   9. Vitamin D deficiency E55.9 268.9   10. Elevated fasting glucose R73.01 790.21     Orders Placed This Encounter   ??? DUPLEX CAROTID BILATERAL     Standing Status:    Future     Standing Expiration Date:   10/26/2019   ??? NOVEL CORONAVIRUS (COVID-19)     Standing Status:   Future     Standing Expiration Date:   09/25/2019     Scheduling Instructions:      1) Due to current limited availability of the COVID-19 PCR test, tests will be prioritized and may not be completed.????            2) Order only if the test result will change clinical management or necessary for a return to mission-critical employment decision.????            3) Print and instruct patient to adhere to CDC home isolation program. (Link Above)????            4) Set up or refer patient for a monitoring program.????            5) Have patient sign up for and leverage MyChart (if not previously done).     Order Specific Question:   Status     Answer:   Symptomatic/Infection Suspected   ??? LIPID PANEL     Standing Status:   Future     Standing Expiration Date:   02/25/2019   ??? VITAMIN B12     Standing Status:   Future     Standing Expiration Date:   02/25/2019   ??? VITAMIN D, 25 HYDROXY     Standing Status:   Future     Standing Expiration Date:   02/25/2019   ??? METABOLIC PANEL, COMPREHENSIVE     Standing Status:   Future     Standing Expiration Date:   02/25/2019   ??? C REACTIVE PROTEIN, QT     Standing Status:   Future     Standing Expiration Date:   02/25/2019   ??? CBC WITH AUTOMATED DIFF     Standing Status:   Future     Standing Expiration Date:   02/25/2019   ??? TSH 3RD GENERATION     Standing Status:   Future     Standing Expiration Date:   02/25/2019   ???  HEMOGLOBIN A1C W/O EAG     Standing Status:   Future     Standing Expiration Date:   02/25/2019   ??? URINALYSIS W/ RFLX MICROSCOPIC     Standing Status:   Future     Standing Expiration Date:   02/25/2019       Sincerely,    Edwin Cap, MD  Note Signed Electronically

## 2018-09-25 NOTE — Progress Notes (Addendum)
Chief Complaint   Patient presents with   ??? Fatigue     1940-10-21  Immunization History   Administered Date(s) Administered   ??? Influenza High Dose Vaccine PF 01/26/2015   ??? Pneumococcal Conjugate (PCV-13) 11/29/2013   ??? Pneumococcal Polysaccharide (PPSV-23) 01/25/2006   ??? Zoster Vaccine, Live 06/29/2013       HISTORIES & HABITS  Medical History:   Past Medical History:   Diagnosis Date   ??? Colon polyps    ??? Depression    ??? Diabetes mellitus (HCC)    ??? High cholesterol    ??? Hypertension    ??? Hypothyroid     hypothyroid and parathyroid adenoma   ??? Osteoarthritis     had bilateral injections hyalgan in knees   ??? Osteoporosis    ??? Overweight      Surgery History:   Past Surgical History:   Procedure Laterality Date   ??? HX BREAST BIOPSY     ??? HX COLONOSCOPY  11/2013   ??? HX HEENT      parathyroid adenoma   ??? HX HIP REPLACEMENT     ??? HX LAP CHOLECYSTECTOMY     ??? HX PARATHYROIDECTOMY       Family History:   Family History   Problem Relation Age of Onset   ??? Hypertension Mother    ??? Colon Cancer Sister      Social History     Socioeconomic History   ??? Marital status: WIDOWED     Spouse name: Not on file   ??? Number of children: Not on file   ??? Years of education: Not on file   ??? Highest education level: Not on file   Tobacco Use   ??? Smoking status: Former Smoker     Packs/day: 0.10     Years: 15.00     Pack years: 1.50     Types: Cigarettes     Last attempt to quit: 06/29/2012     Years since quitting: 6.2   ??? Smokeless tobacco: Never Used   Substance and Sexual Activity   ??? Alcohol use: No     Alcohol/week: 0.0 standard drinks     Health Maintenance   Topic Date Due   ??? Foot Exam Q1  05/12/1950   ??? MICROALBUMIN Q1  05/12/1950   ??? Eye Exam Retinal or Dilated  05/12/1950   ??? DTaP/Tdap/Td series (1 - Tdap) 05/11/1962   ??? Shingrix Vaccine Age 58> (1 of 2) 05/12/1991   ??? Pneumococcal 65+ years (2 of 2 - PPSV23) 11/30/2014   ??? Medicare Yearly Exam  05/16/2018   ??? Influenza Age 25 to Adult  10/25/2018    ??? GLAUCOMA SCREENING Q2Y  02/07/2019   ??? Bone Densitometry  03/03/2020     Allergies   Allergen Reactions   ??? Statins-Hmg-Coa Reductase Inhibitors Myalgia       Current Outpatient Medications:   ???  losartan (COZAAR) 50 mg tablet, TAKE 1 TABLET BY MOUTH EVERY DAY, Disp: 90 Tab, Rfl: 1  ???  losartan (COZAAR) 50 mg tablet, Take 1 Tab by mouth daily., Disp: 90 Tab, Rfl: 1  ???  azelastine (ASTELIN) 137 mcg (0.1 %) nasal spray, SPRAY 2 SPRAYS TWO TIMES DAILY, Disp: , Rfl:   ???  FLOVENT HFA 110 mcg/actuation inhaler, TAKE 2 PUFFS BY INHALATION EVERY TWELVE (12) HOURS., Disp: 12 Inhaler, Rfl: 1  ???  spironolactone-hydrochlorothiazide (ALDACTAZIDE) 25-25 mg per tablet, Take 1 Tab by mouth daily., Disp: 90 Tab, Rfl: 3  ???  levothyroxine (SYNTHROID) 125 mcg tablet, TAKE 1 TABLET EVERY DAY BEFORE BREAKFAST, Disp: 90 Tab, Rfl: 3  ???  ezetimibe (ZETIA) 10 mg tablet, Take  by mouth., Disp: , Rfl:   ???  multivitamin (ONE A DAY) tablet, Take 1 Tab by mouth daily., Disp: , Rfl:   ???  B.infantis-B.ani-B.long-B.bifi 10-15 mg TbEC, Take  by mouth., Disp: , Rfl:   ???  cholecalciferol (VITAMIN D3) 1,000 unit cap, Take  by mouth daily., Disp: , Rfl:   ???  cyanocobalamin (VITAMIN B-12) 1,000 mcg sublingual tablet, Take 1,000 mcg by mouth daily., Disp: , Rfl:       There were no vitals taken for this visit.    Chief Complaint   Patient presents with   ??? Fatigue     HPI: telemed  Melissa Mclean has h/o parathyroid adenoma, hypothyroidism, calcium score 730, overweight, high cholesterol, prediabetes and B12 deficiency now dedicated to weight loss. Going to [employer]. Does not tolerate statins- low dose     09/25/18 Patient was read their rights and verbal consent was received during COVID quarantine. Conducted on [employer] video portal.  In [place]- came up from [place] 2 weeks ago- really tired- needs labs. Going back to [place] in 2 weeks   Fatigue no other symptoms started past few days- busy packing- no chest  pain or shortness of breath no fever chills. Appetite normal. Sleeps downstairs, packed up everything.  Coughing more- unclear if exposure  Went for a hearing test- hears whooshing in ear asking for Carotid doppler.    6/20 Doing really well   Saw nutritioninst omega 3 glycemic health raw b complex multi d 3   Deep breath is causing cough- comes and goes for several months lungs clear- using  flovent twice a day, no wheeze  Decreased hearing- has hearing aids     Weight stable   Had colonsocpy unable to advance Dr Allena KatzPatel- instead ordered  Barium enema normal- terrible experience Dr Allena KatzPatel did not follow up- scar tissue from prior polyps likely  Cologuard in 2021 instead of colonoscopy, fam hist colon ca  Taking 1/2 metformin and aldactizide 1/2 BP low no side effects  ??  07/29/18 Patient was read their rights and verbal consent was received during COVID quarantine. Conducted on [employer] video portal.  Came down in January was very sick for 2 weeks treated with 7 medications lung Xray nebulizer prednisone. Followed up with pulmonary in [place] stated on azelastine stopped the coughing. Asking for COVID testing high risk age.   Taking precautions very careful- only seeing son and daughterin law-   COVID supplements reviewed- D, multi A Zinc and extra zinc 30mg   and C 60 mg elderberry and c 500 zinc 7.5  Diet reviewed   Will buy BP cuff and sugar monitor- will check am and after eating alternating.  Will order sars igg when calls back local lab- other labs when quarantine lifts  Weighing herself stable cooking 3 meals a day.  243 weight lights 5 minutes -10 3 x day   Does not need nebulazer- 2 spots on lung repeat xr 11/20    Swollen ankles when flew from [place] 1 week ago did not bring HCTZ- when called a few days ago thought it was aldactizide sent into pharmacy 2 days ago   Since starting aldactizide ankles are going down significantly. Ankles were huge could not put normal shoes on, today wearing shoes not zipped  up- ankle swelling happening too frequently  Getting ankle  cramps in feet- drinking a lot of water, not taking magnesium  Tried crestor 2x week could not tolerate- sees IP Candiss Norse- nuclear stress test and echo 7/19 normal. Saw Dr Candiss Norse 10/19     Review of System  Annual: 05/15/17  General: denies  sleep problems, weight  Loss 9# + fatigue  Eyes: denies eye pain, change in vision, blurring  ENT: denies nosebleeds, sore throat, sinus  Cardiovascular: denies chest pains, palpitations, dyspnea on exertion, + ankle edema bilateral - unable to tolerate statin BIW 06/27/17 Calcium score 730  particle number [id number] and pattern A on zetia seeing IP Singh stress test 7/19  5/1/319 note from pt seeing Dr Gwenyth Allegra Candiss Norse echo nl 5/19 cant toletate statins even twice a week not thinking psk-9 right now; 7/19 nuclear stress test negative   09/29/18 carotid mild plaque right minimal left no sig  stenosis  Respiratory: no cough no wheeze- bronchospasm in past usually after bronchitis   Bad bronchitis 3/20 ?COVID cough persits  03/31/18 dr Glo Herring noted PFT restrictive lung disease   Gastrointestinal: denies abdominal pain, change in bowel habits, GERD  GU: denies UTI history, nocturia- 12/19 pelvic US 4 cm right ovarian cyst, 2 fibroids 3cm uterus repeat 1 year  Musculoskeletal: denies back pain, joint pain- knees improved with diet- broken toe 4th right foot 1/19   Skin: denies rash, change in moles, excema  Neuro: nl ambulation, cognitive nl  Psychiatric: No memory change, mood stable  HemeLymphatic: denies abnormal bruising, bleeding, enlarged lymph nodes, anemia  Allergic/Immunologic: denies persistent infections, HIV exposure, hay fever  +ANA 1:80  Exercise:walking   Diet: healthy vegetables chicken eggs some bread and pasta  Reports varicella  ??  Physical Exam  General: well nourished, well hydrated, no acute distress  Head: normocephalic  Eye: conjunctivae and lids normal  MWN:UUVO Cavity: no lesion, pharynx normal   Neck: supple, no masses, no lymphadenopathy  Respiratory: no rales, rhonchi, wheeze  Cardiovascular: RRR, S1 and S2 normal, no murmur ST +edema ankles only neo erythema   Breast: deferred  Peripheral circulation: no cyanosis, clubbing, edema + spider veins  Gastrointestinal: NTND no HSM  Skin: no rashes, change in moles  Neurologic: non focal vibration intact distal  Cranial nerves: II - XII grossly intact  ZDG:UYQIHKVQQ and insight: intact  VZD:GLOV and neck: normal alignment and mobility  Digit and nail: no clubbing, cyanosis, petechiae, or nodes  Spine, rib, pelvis: normal alignment, no deformity  Gait and station: normal- right 4th toe slight increase  ????  Assessment-Plan: follow up- telemed in [place]  -Fatigue since getting back from [place] and cleaning out boxes in the house.  Unclear if allergen exposure.  Cough slightly increased.  Recommend check lab.  - whooshing in ear-audiologist recommended carotid Doppler- 09/29/18 carotid mild plaque right minimal left no sig stenosis  -Recent bronchitis on several medications and inhalers has pulmonary doctor in [place] saw Dr. Glo Herring in [place].  SARS-CoV-2 testing IgG negative.   Had 2 small spots on lungs unlikely to be a problem we will repeat chest x-ray 11/20  - BP on meds-ordering BP cuff from pharmacy  -A1c 6 off meds recommend diet and exercise strategies will order glucose monitor with strips-check sometimes fasting and sometimes after meals to advise on what to eat.  -Overweight prediabetes last A1c 6.0 not on meds-consider intermittent fasting for weight loss continue low-carb diet.  Increase light weights 5 to 10 minutes in chair several times a day while watching TV, swimming and  walking.  -Start supplements reviewed patient taking  - Sinus tachycardia- no episodes at home  - edema on aldactizide TIW improved  It is not recommended to fly right now- letter written for airline  - cramps legs improve start magnesium glycinate 100mg  bid   03/31/18 dr Emelda Fearferguson noted PFT restrictive lung disease   - compression deformity on CXR_ h/o hyperparathyroidism check dexa consider boniva or prolia if low or refer to endocrine  -  BP controlled on losartan 50 and aldactizide  - calcium score 730 follow may need PSK-9 inhibitor in future taking zetia sees IP singh  - Weight loss on weight watchers- conitnue diet reviewed  - high cholesterol   - follow tsh and intact PTH with prior history  - high cholesterol on zetia cant tolerate any statin- stress test and echo 7/19 nl  - mammogarm ordered and dexa 7/20  shingrix at pharmacy   pneumavax rec next year- pt defers    ??  DATA 11/16 chol 252/58/163/157 a1c 6.1 tsh 1.2 cmp nl   06/13/15 ldl particle 2049 ldl 164 chol 256/59/164/163 tsh 2.0 a1c 5.9  12/06/15 cmp nl chol 259/55/164/198 tsh 2.3 PTH 27 a1c 6.0  08/03/16 cmp nl chol 242/66/145/168 tsh 1.4 a1c 5.8 d 43 increase b12 continue weight loss offer bydureon pen to pt  12/21/16 note Dr Allena KatzPatel colonoscopy unalbe to advance scope likely order cologuard; BE if needed  01/14/17 cbc nl cmp nl tsh 2 a1c 5.7 pth 26 b12 368 chol 273/72/165/199   Spoke to pt - took lab after cruise, add b12 500 BIW recheck cholesterol with improved diet  Had colonoscopy couldn't go through scheduled barium enema  05/14/17 cmp nl glu 108 a1c 5.8 b12 809 tsh 1.83 chol 217/69/127/107 ratio 3.1 ldl particle 1399 size nl pattern A peak size normal apoB 100 lpa-152 hscrp 1.6 LP-PLA2 103 message left still recommend calcium score for confirmation that medication not necessary  06/27/17 refer to cardiology Calcium score 730 cant take statins losing weight cholesterol lower than in past, particle number 1399 and pattern A start zetia  consider PSK-9 inhibitor   5/1/319 note from pt seeing Dr Thedore MinsSingh echo scheduled started on crestor 5 BIW in addition to crestor - not thinking psk-9 right now  09/07/17 cbc nl cmp glu 102 k 4.3 cr 0.7 a1c 5.7 tsh 1.2 ck 90 chol  228/62/136/164 not on crestor or zetia  same medication fax lab to dr IP  singh has appt this week  09/16/17 echo Dr Thedore MinsSingh bp 135/90 nl asking about need for stress test   12/28/17 Dr IP singh noted lft nl chol 210/83/110/84 direct LDL 118  02/21/18 phone call flew in last week very busy ankles so swollen weight watchers   [facility] more salt this week- did not take aldactizide with her   Cough persists 02/28/18 add clarithromycin  03/04/18 cmp glu 105 sodium 134 k 4.5 ANA 1:80 homogeneous crp 5.8 mag 1.8 a1c 6 RF <14 tsh 1.66 messaged pt- go back to losartan and HCT for next refill spoke to pt   Had cholesterol with dr Thedore Minssingh   Cough is better on biaxin feeling well   02/27/18 CXR mild compression deformities mid thoracic spine no pulmonary disease- dexa ordered  02/27/18 pelvic US 4cm ovarian cyst smal fibroids 3cm repeat 1 year  03/03/18 dexa -1.4 s -1.3 h repeat 2-3 years   03/31/18 dr Emelda Fearferguson noted PFT restrictive lung disease   09/25/18 covid igg neg  09/29/18 carotid mild plaque right minimal left no sig  stenosis  Cbc 1 immature gran cmp glu 107 chol 224/61/119/220 B12 785 crp 2 tsh 0.8 d 34 a1c 6.3     Discussed the patient's BMI with her.  I have recommended the following interventions: dietary management education, guidance, and counseling.       I attest that current meds have been reviewed and are accurate.          Counseling >50% time spent this visit      Encounter Diagnoses     ICD-10-CM ICD-9-CM   1. Fatigue, unspecified type R53.83 780.79   2. Agatston CAC score, >400 R93.1 793.2   3. Abnormal sensation in ear, left H93.8X2 388.8   4. Other specified symptoms and signs involving the circulatory and respiratory systems  R09.89 785.9     786.9   5. Hypothyroidism due to Hashimoto's thyroiditis E03.8 244.8    E06.3 245.2   6. Type 2 diabetes mellitus without complication, without long-term current use of insulin (HCC) E11.9 250.00   7. High cholesterol E78.00 272.0   8. B12 deficiency E53.8 266.2    9. Vitamin D deficiency E55.9 268.9   10. Elevated fasting glucose R73.01 790.21     Orders Placed This Encounter   ??? DUPLEX CAROTID BILATERAL     Standing Status:   Future     Standing Expiration Date:   10/26/2019   ??? NOVEL CORONAVIRUS (COVID-19)     Standing Status:   Future     Standing Expiration Date:   09/25/2019     Scheduling Instructions:      1) Due to current limited availability of the COVID-19 PCR test, tests will be prioritized and may not be completed.????            2) Order only if the test result will change clinical management or necessary for a return to mission-critical employment decision.????            3) Print and instruct patient to adhere to CDC home isolation program. (Link Above)????            4) Set up or refer patient for a monitoring program.????            5) Have patient sign up for and leverage MyChart (if not previously done).     Order Specific Question:   Status     Answer:   Symptomatic/Infection Suspected   ??? LIPID PANEL     Standing Status:   Future     Standing Expiration Date:   02/25/2019   ??? VITAMIN B12     Standing Status:   Future     Standing Expiration Date:   02/25/2019   ??? VITAMIN D, 25 HYDROXY     Standing Status:   Future     Standing Expiration Date:   02/25/2019   ??? METABOLIC PANEL, COMPREHENSIVE     Standing Status:   Future     Standing Expiration Date:   02/25/2019   ??? C REACTIVE PROTEIN, QT     Standing Status:   Future     Standing Expiration Date:   02/25/2019   ??? CBC WITH AUTOMATED DIFF     Standing Status:   Future     Standing Expiration Date:   02/25/2019   ??? TSH 3RD GENERATION     Standing Status:   Future     Standing Expiration Date:   02/25/2019   ???  HEMOGLOBIN A1C W/O EAG     Standing Status:   Future     Standing Expiration Date:   02/25/2019   ??? URINALYSIS W/ RFLX MICROSCOPIC     Standing Status:   Future     Standing Expiration Date:   02/25/2019       Sincerely,    Edwin Cap, MD  Note Signed Electronically

## 2018-09-25 NOTE — Telephone Encounter (Signed)
Patient will go directly to the lab and does not need the script.  Spoke to patient.

## 2018-09-25 NOTE — Telephone Encounter (Signed)
noted 

## 2018-09-29 ENCOUNTER — Inpatient Hospital Stay: Admit: 2018-09-29 | Payer: MEDICARE | Attending: Internal Medicine | Primary: Internal Medicine

## 2018-09-29 DIAGNOSIS — R0989 Other specified symptoms and signs involving the circulatory and respiratory systems: Secondary | ICD-10-CM

## 2018-09-29 DIAGNOSIS — E7800 Pure hypercholesterolemia, unspecified: Secondary | ICD-10-CM

## 2018-09-29 DIAGNOSIS — R5383 Other fatigue: Secondary | ICD-10-CM

## 2018-09-29 LAB — CBC WITH AUTOMATED DIFF
ABS. BASOPHILS: 0.1 10*3/uL (ref 0.0–0.4)
ABS. EOSINOPHILS: 0.2 10*3/uL (ref 0.0–1.0)
ABS. IMM. GRANS.: 0.1 10*3/uL (ref 0.0–0.17)
ABS. LYMPHOCYTES: 3.2 10*3/uL (ref 0.9–4.2)
ABS. MONOCYTES: 0.8 10*3/uL (ref 0.1–1.7)
ABS. NEUTROPHILS: 6.2 10*3/uL (ref 2.3–7.6)
ABSOLUTE NRBC: 0 10*3/uL (ref 0.0–0.01)
BASOPHILS: 1 % (ref 0.0–3.0)
EOSINOPHILS: 2 % (ref 0.0–7.0)
HCT: 42 % (ref 36.0–47.0)
HGB: 13.7 g/dL (ref 12.0–16.0)
IMMATURE GRANULOCYTES: 1 % — ABNORMAL HIGH (ref 0–0.5)
LYMPHOCYTES: 30 % (ref 18.0–40.0)
MCH: 28.2 PG (ref 27.0–35.0)
MCHC: 32.6 g/dL (ref 30.7–37.3)
MCV: 86.6 FL (ref 81.0–94.0)
MONOCYTES: 8 % (ref 2.0–12.0)
MPV: 9.2 FL (ref 9.2–11.8)
NEUTROPHILS: 59 % (ref 48.0–72.0)
NRBC: 0 PER 100 WBC
PLATELET: 354 10*3/uL (ref 130–400)
RBC: 4.85 M/uL (ref 4.20–5.40)
RDW: 14.9 % — ABNORMAL HIGH (ref 11.5–14.0)
WBC: 10.5 10*3/uL (ref 4.8–10.6)

## 2018-09-29 LAB — METABOLIC PANEL, COMPREHENSIVE
A-G Ratio: 1 (ref 1.0–1.5)
ALT (SGPT): 19 U/L (ref 12–78)
AST (SGOT): 16 U/L (ref 15–37)
Albumin: 3.6 g/dL (ref 3.4–5.0)
Alk. phosphatase: 90 U/L (ref 46–116)
Anion gap: 7 mmol/L — ABNORMAL LOW (ref 8–20)
BUN: 21 mg/dL — ABNORMAL HIGH (ref 7–18)
Bilirubin, total: 0.5 mg/dL (ref 0.2–1.0)
CO2: 27 mmol/L (ref 21–32)
Calcium: 9.6 mg/dL (ref 8.5–10.1)
Chloride: 102 mmol/L (ref 98–107)
Creatinine: 0.82 mg/dL (ref 0.55–1.02)
GFR est AA: >60 mL/min/{1.73_m2} (ref >60)
GFR est non-AA: >60 mL/min/{1.73_m2} (ref >60)
Globulin: 3.5 g/dL (ref 2.5–5.0)
Glucose: 107 mg/dL — ABNORMAL HIGH (ref 74–106)
Potassium: 4.1 mmol/L (ref 3.5–5.1)
Protein, total: 7.1 g/dL (ref 6.4–8.2)
Sodium: 136 mmol/L (ref 136–145)

## 2018-09-29 LAB — AMB EXT LDL-C
LDL-C, External: 119
LDL-C, External: 119 NA

## 2018-09-29 LAB — LIPID PANEL
CHOL/HDL Ratio: 3.7
Chol/HDL Ratio: 3.7
Cholesterol, Total: 224 mg/dL — ABNORMAL HIGH (ref <200)
Cholesterol, total: 224 mg/dL — ABNORMAL HIGH (ref <200)
HDL Cholesterol: 61 mg/dL — ABNORMAL HIGH (ref 40–60)
HDL: 61 mg/dL — ABNORMAL HIGH (ref 40–60)
LDL Calculated: 119 MG/DL (ref 0–130)
LDL, calculated: 119 MG/DL (ref 0–130)
Triglyceride: 220 mg/dL — ABNORMAL HIGH (ref <150)
Triglycerides: 220 mg/dL — ABNORMAL HIGH (ref <150)
VLDL Cholesterol Calculated: 44 MG/DL — ABNORMAL HIGH (ref 3–35)
VLDL, calculated: 44 MG/DL — ABNORMAL HIGH (ref 3–35)

## 2018-09-29 LAB — VITAMIN B12
Vitamin B-12: 785 pg/mL (ref 193–986)
Vitamin B12: 785 pg/mL (ref 193–986)

## 2018-09-29 LAB — HEMOGLOBIN A1C W/O EAG
Hemoglobin A1C: 6.3 % (ref 4.2–6.3)
Hemoglobin A1c: 6.3 % (ref 4.2–6.3)

## 2018-09-29 LAB — TSH 3RD GENERATION
TSH: 0.806 u[IU]/mL (ref 0.360–3.740)
TSH: 0.806 u[IU]/mL (ref 0.360–3.740)

## 2018-09-29 LAB — VITAMIN D, 25 HYDROXY: Vitamin D 25-Hydroxy: 34.6 ng/mL

## 2018-09-29 LAB — CBC WITH AUTO DIFFERENTIAL
Basophils %: 1 % (ref 0.0–3.0)
Basophils Absolute: 0.1 10*3/uL (ref 0.0–0.4)
Eosinophils %: 2 % (ref 0.0–7.0)
Eosinophils Absolute: 0.2 10*3/uL (ref 0.0–1.0)
Granulocyte Absolute Count: 0.1 10*3/uL (ref 0.0–0.17)
Hematocrit: 42 % (ref 36.0–47.0)
Hemoglobin: 13.7 g/dL (ref 12.0–16.0)
Immature Granulocytes: 1 % — ABNORMAL HIGH (ref 0–0.5)
Lymphocytes %: 30 % (ref 18.0–40.0)
Lymphocytes Absolute: 3.2 10*3/uL (ref 0.9–4.2)
MCH: 28.2 PG (ref 27.0–35.0)
MCHC: 32.6 g/dL (ref 30.7–37.3)
MCV: 86.6 FL (ref 81.0–94.0)
MPV: 9.2 FL (ref 9.2–11.8)
Monocytes %: 8 % (ref 2.0–12.0)
Monocytes Absolute: 0.8 10*3/uL (ref 0.1–1.7)
NRBC Absolute: 0 10*3/uL (ref 0.0–0.01)
Neutrophils %: 59 % (ref 48.0–72.0)
Neutrophils Absolute: 6.2 10*3/uL (ref 2.3–7.6)
Nucleated RBCs: 0 PER 100 WBC
Platelets: 354 10*3/uL (ref 130–400)
RBC: 4.85 M/uL (ref 4.20–5.40)
RDW: 14.9 % — ABNORMAL HIGH (ref 11.5–14.0)
WBC: 10.5 10*3/uL (ref 4.8–10.6)

## 2018-09-29 LAB — COMPREHENSIVE METABOLIC PANEL
ALT: 19 U/L (ref 12–78)
AST: 16 U/L (ref 15–37)
Albumin/Globulin Ratio: 1 (ref 1.0–1.5)
Albumin: 3.6 g/dL (ref 3.4–5.0)
Alkaline Phosphatase: 90 U/L (ref 46–116)
Anion Gap: 7 mmol/L — ABNORMAL LOW (ref 8–20)
BUN: 21 mg/dL — ABNORMAL HIGH (ref 7–18)
CO2: 27 mmol/L (ref 21–32)
Calcium: 9.6 mg/dL (ref 8.5–10.1)
Chloride: 102 mmol/L (ref 98–107)
Creatinine: 0.82 mg/dL (ref 0.55–1.02)
EGFR IF NonAfrican American: >60 mL/min/{1.73_m2} (ref >60)
GFR African American: >60 mL/min/{1.73_m2} (ref >60)
Globulin: 3.5 g/dL (ref 2.5–5.0)
Glucose: 107 mg/dL — ABNORMAL HIGH (ref 74–106)
Potassium: 4.1 mmol/L (ref 3.5–5.1)
Sodium: 136 mmol/L (ref 136–145)
Total Bilirubin: 0.5 mg/dL (ref 0.2–1.0)
Total Protein: 7.1 g/dL (ref 6.4–8.2)

## 2018-09-29 LAB — VITAMIN D 25 HYDROXY: Vit D, 25-Hydroxy: 34.6 ng/mL

## 2018-09-30 LAB — C REACTIVE PROTEIN, QT: C-Reactive Protein, Qt: 2 mg/L (ref 0–10)

## 2018-09-30 LAB — C-REACTIVE PROTEIN: CRP: 2 mg/L (ref 0–10)

## 2018-10-01 LAB — COVID-19, NP
SARS-CoV-2: NOT DETECTED
SARS-CoV-2: NOT DETECTED

## 2018-10-18 MED ORDER — LEVOTHYROXINE 125 MCG TAB
125 mcg | ORAL_TABLET | ORAL | 0 refills | Status: DC
Start: 2018-10-18 — End: 2019-01-14

## 2019-01-14 MED ORDER — LEVOTHYROXINE 125 MCG TAB
125 mcg | ORAL_TABLET | ORAL | 3 refills | Status: AC
Start: 2019-01-14 — End: ?

## 2019-01-28 MED ORDER — SPIRONOLACTON-HYDROCHLOROTHIAZ 25 MG-25 MG TAB
25-25 mg | ORAL_TABLET | ORAL | 0 refills | Status: DC
Start: 2019-01-28 — End: 2019-04-22

## 2019-03-06 IMAGING — CT CT CHEST WITHOUT CONTRAST
2 of 3 series · 15 of 36 positions shown, 18 images · non-contrast
Comparison: Comparison was made to the prior exam(s) within the last 12 months 
dated  CT chest 03/12/2018.

CT CHEST WITHOUT CONTRAST, 03/06/2019 [DATE]: 
CLINICAL INDICATION:  Lung nodule. 
A search for DICOM formatted images was conducted for prior CT imaging studies 
completed at a non-affiliated media free facility.
TECHNIQUE: The chest was scanned from base of neck through the lung bases 
without contrast on a high resolution low dose CT scanner. Routine MPR and MIP 
3D renderings were reconstructed on an independent workstation with concurrent 
physician supervision.

[Series 2: chest 2.0 i31s 3 · axial · 0.67mm/px · z∈[-279,-33]mm · 12 of 145 slices shown, 15 images]
[im 11/145  mediastinal]
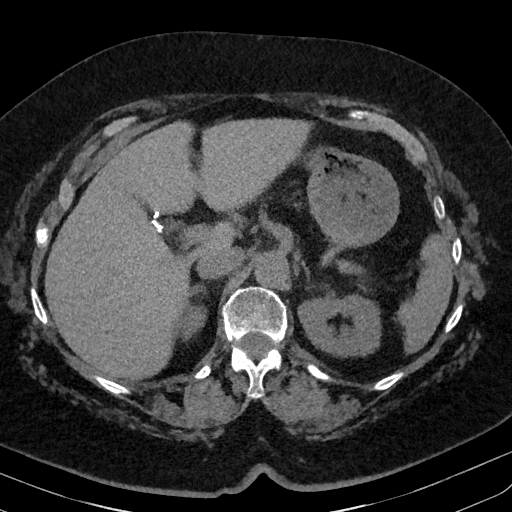
[im 11/145  lung]
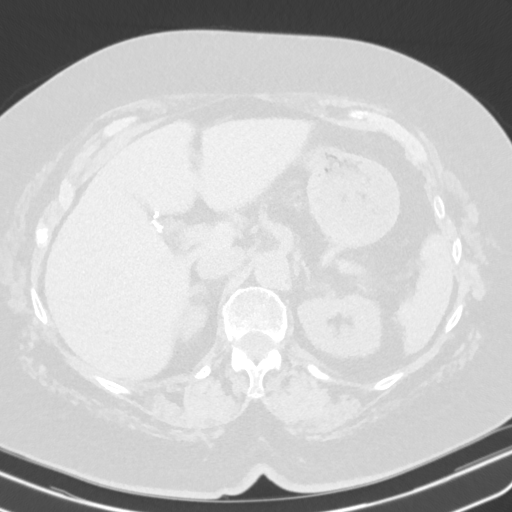
[im 22/145  lung]
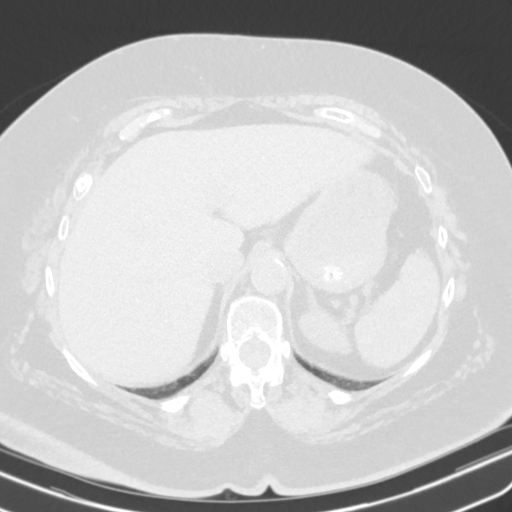
[im 33/145  lung]
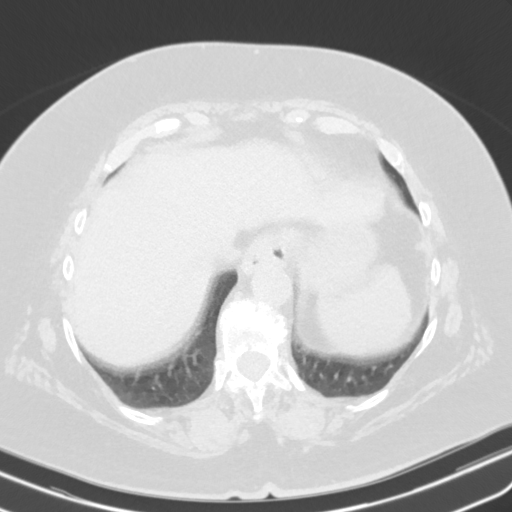
[im 43/145  lung]
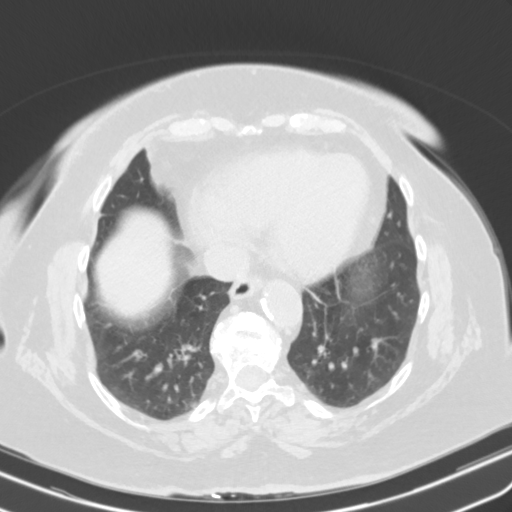
[im 54/145  mediastinal]
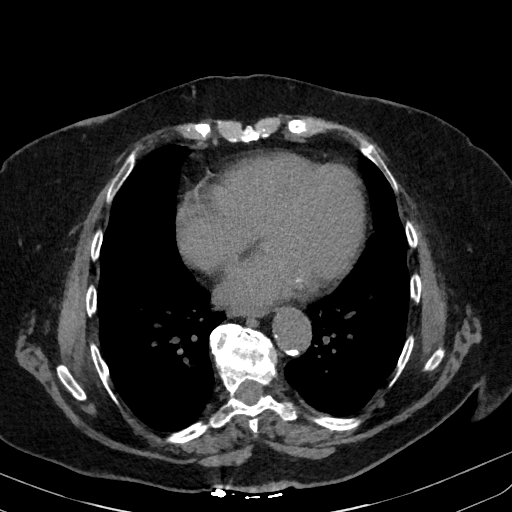
[im 54/145  lung]
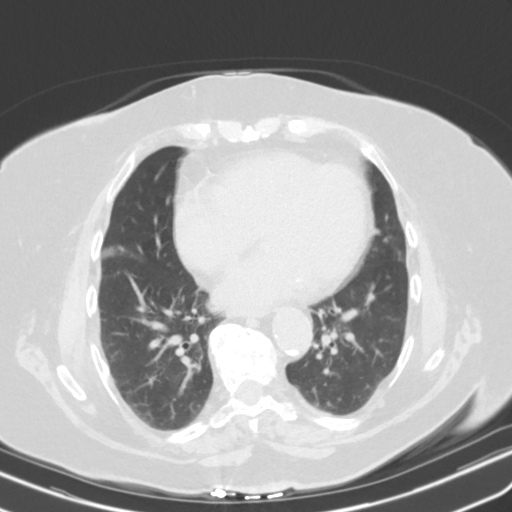
[im 65/145  lung]
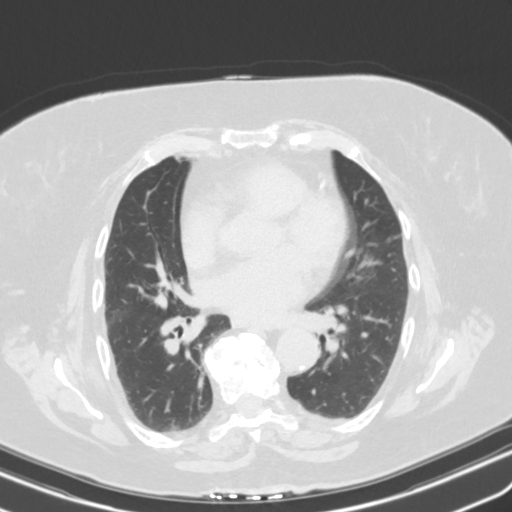
[im 81/145  lung]
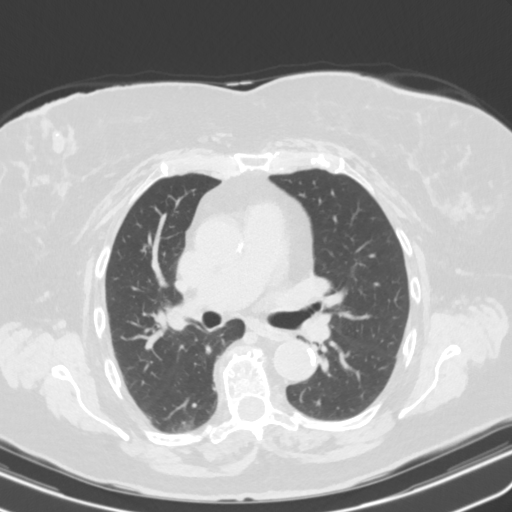
[im 91/145  lung]
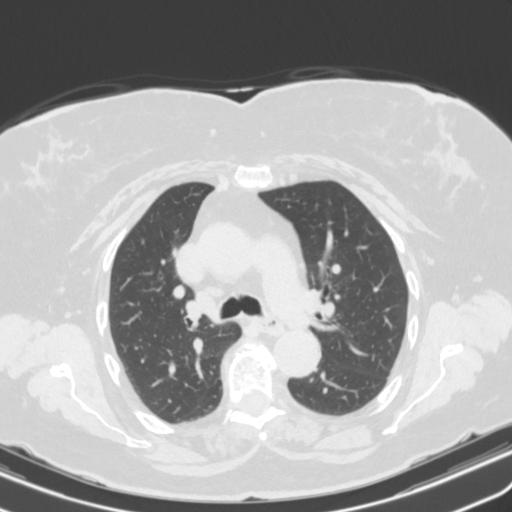
[im 102/145  mediastinal]
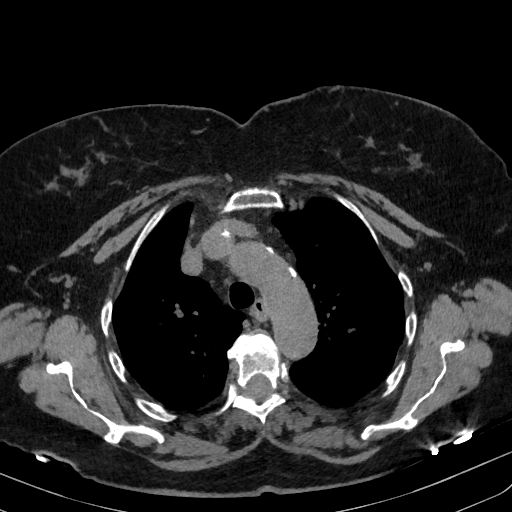
[im 102/145  lung]
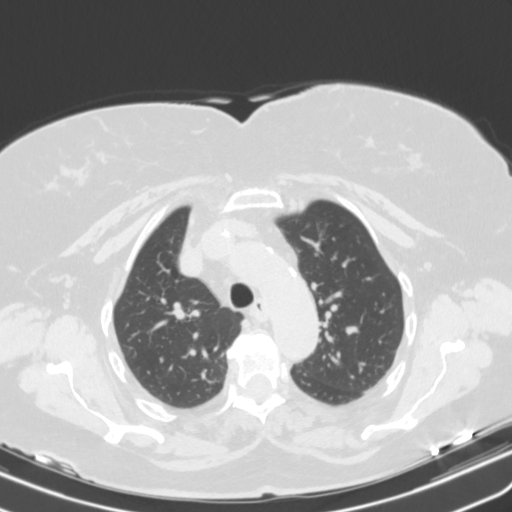
[im 113/145  lung]
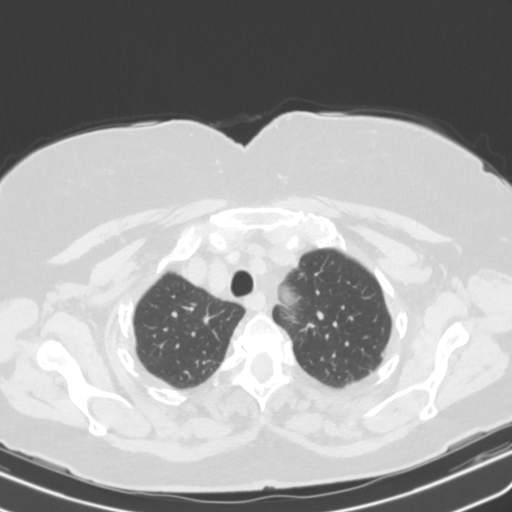
[im 123/145  lung]
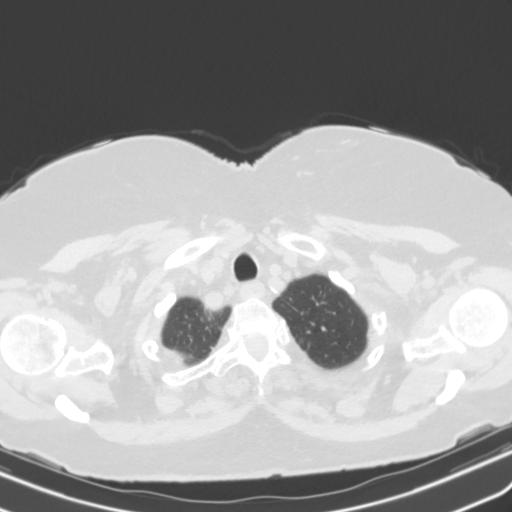
[im 134/145  lung]
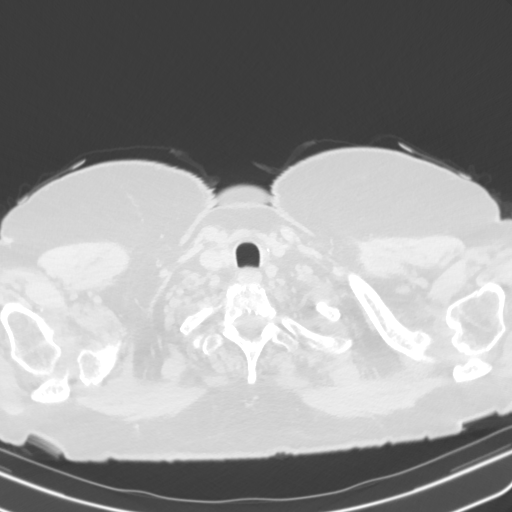

[Series 5: coronal · coronal · 0.56mm/px · 3 of 128 slices shown]
[im 26/128  lung]
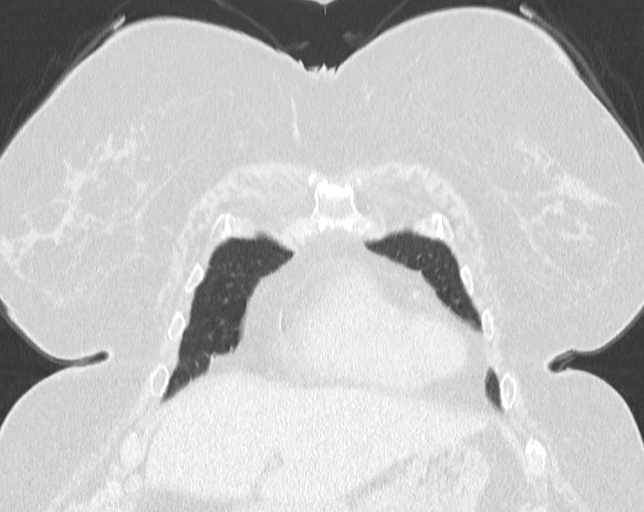
[im 51/128  lung]
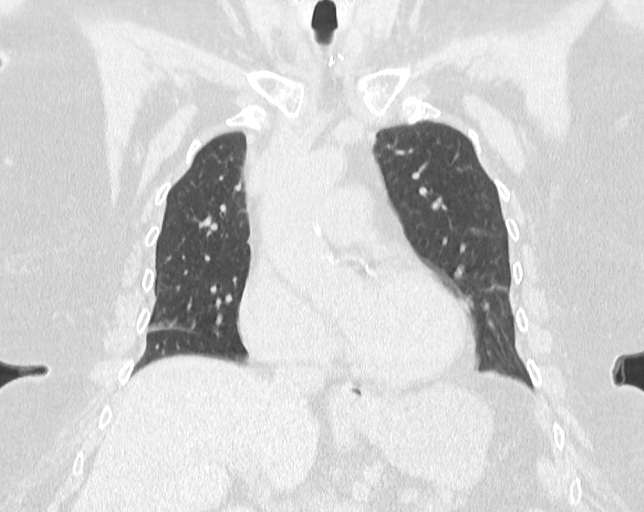
[im 77/128  lung]
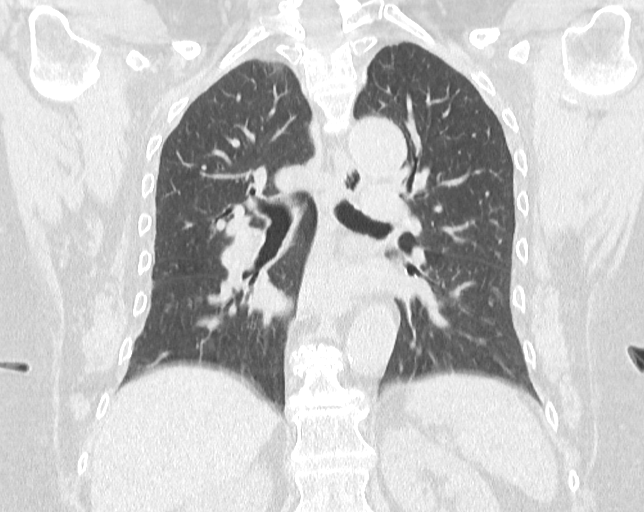

[15 of 36 positions shown; findings below may reference images not displayed]

FINDINGS: LUNGS AND PLEURA: Noncalcified nodule right lower lobe measuring 4 mm (image 
#20) and right upper lobe measuring 3 mm. Mild volume loss within the lingula. 
No new pulmonary nodules..  No effusions. 
MEDIASTINUM: No masses. 
LYMPH NODES: No adenopathy. 
HEART: Normal in size.  No pericardial effusion. Moderate severe coronary artery 
calcification. 
AORTA AND GREAT VESSELS: No aneurysm. 
OSSEOUS STRUCTURES: Degenerative change and kyphosis. No acute fracture or 
destructive lesion. 
UPPER ABDOMEN: Small hiatal hernia. Status post cholecystectomy.
IMPRESSION: Noncalcified pulmonary nodules right upper lobe and right lower lobe the largest 
the right lower lobe measuring 4 mm. Please for to the [HOSPITAL] 
recommendations for follow-up below. 
No mediastinal or hilar adenopathy. 
Small hiatal hernia . 
Incidental, multiple solid nodules < 6 mm: 
Low Risk: No Routine follow-up. 
High risk: Optional CT at 12 months. 
NOTE: These recommendations do not apply to patients with immunosuppression or 
patients with known primary cancer. 
REFERENCE: [HOSPITAL] 8454 Guidelines for Management of Incidentally 
Detected Pulmonary Nodules in Adults. Radiology 8454. . 
RADIATION DOSE REDUCTION: All CT scans are performed using radiation dose 
reduction techniques, when applicable.? Technical factors are evaluated and 
adjusted to ensure appropriate moderation of exposure.? Automated dose 
management technology is applied to adjust the radiation doses to minimize 
exposure while achieving diagnostic quality images.

## 2019-03-26 NOTE — Progress Notes (Signed)
Health Promotion & Risk Prevention Initial Outreach   Medicare Depression & Fall Screening     Spoke with Patient, explained purpose of my role on their health care team, agreeable to outreach.    Falls last 12 months: 0  Any injuries? N/A     Depression screen assessed no risks noted. Fall assessment completed no past falls or risk for falls noted.     Additional patient information: Pt has right knee pain, would like to have surgical intervention, has consulted with two providers.     Outreach completed, no further outreach necessary.

## 2019-03-26 NOTE — Progress Notes (Signed)
Health Promotion & Risk Prevention Initial Outreach   Medicare Depression & Fall Screening     Spoke with Patient, explained purpose of my role on their health care team, agreeable to outreach.    Falls last 12 months: 0  Any injuries? N/A     Depression screen assessed no risks noted. Fall assessment completed no past falls or risk for falls noted.     Additional patient information: Pt has right knee pain, would like to have surgical intervention, has consulted with two providers.     Outreach completed, no further outreach necessary.

## 2019-04-22 MED ORDER — SPIRONOLACTON-HYDROCHLOROTHIAZ 25 MG-25 MG TAB
25-25 mg | ORAL_TABLET | ORAL | 3 refills | Status: DC
Start: 2019-04-22 — End: 2020-07-05

## 2019-10-22 MED ORDER — LOSARTAN 50 MG TAB
50 mg | ORAL_TABLET | ORAL | 1 refills | Status: AC
Start: 2019-10-22 — End: ?

## 2020-05-13 IMAGING — CT CT CHEST WITHOUT CONTRAST
2 of 3 series · 15 of 36 positions shown, 18 images · non-contrast
Comparison: CT 03/06/2019

CT CHEST WITHOUT CONTRAST, 05/13/2020 [DATE]: 
CLINICAL INDICATION:  Pulmonary nodule. 
A search for DICOM formatted images was conducted for prior CT imaging studies 
completed at a non-affiliated media free facility.
TECHNIQUE: The chest was scanned from base of neck through the lung bases 
without contrast on a high resolution low dose CT scanner. Routine MPR and MIP 
3D renderings were reconstructed on an independent workstation with concurrent 
physician supervision.

[Series 2: chest 2.0 i31s 3 · axial · 0.68mm/px · z∈[-290,-28]mm · 12 of 155 slices shown, 15 images]
[im 12/155  mediastinal]
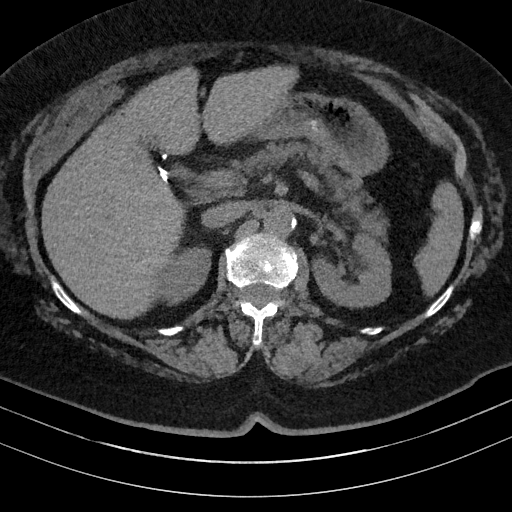
[im 12/155  lung]
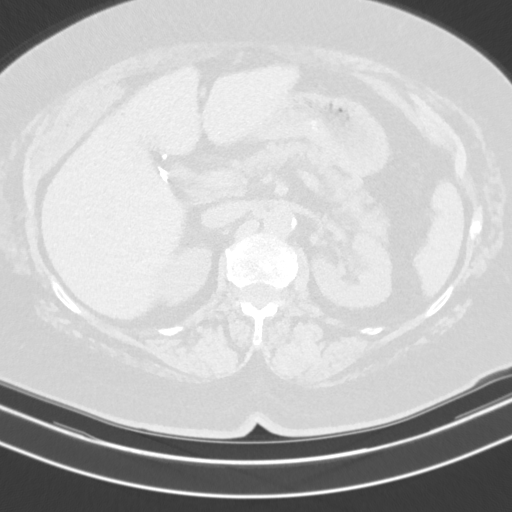
[im 23/155  lung]
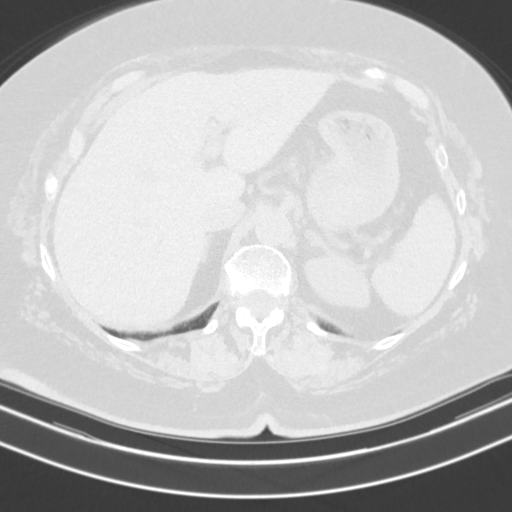
[im 35/155  lung]
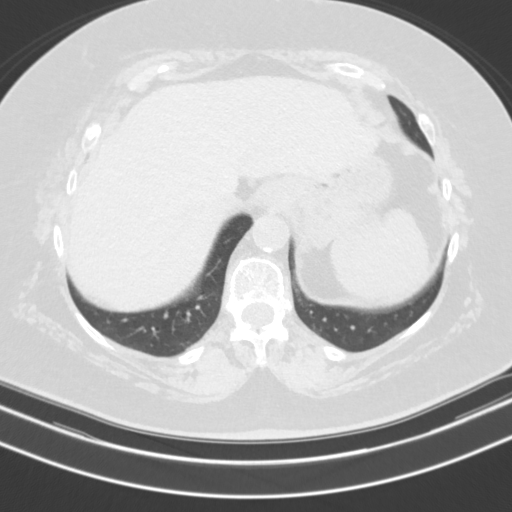
[im 46/155  lung]
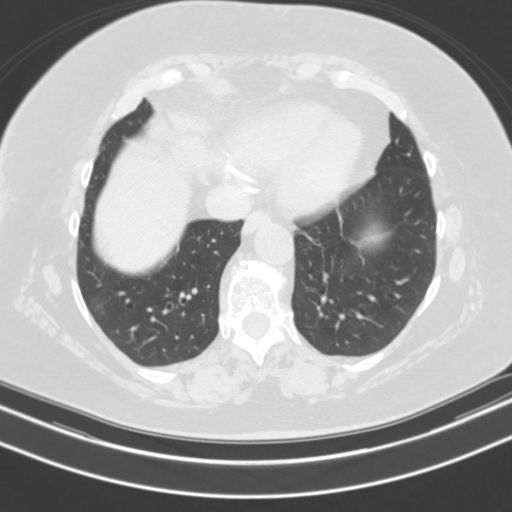
[im 58/155  mediastinal]
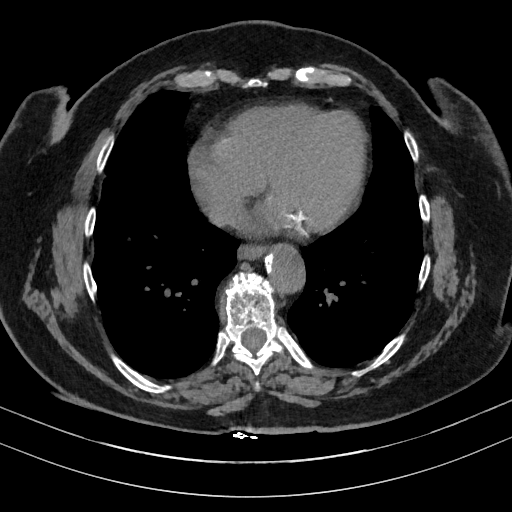
[im 58/155  lung]
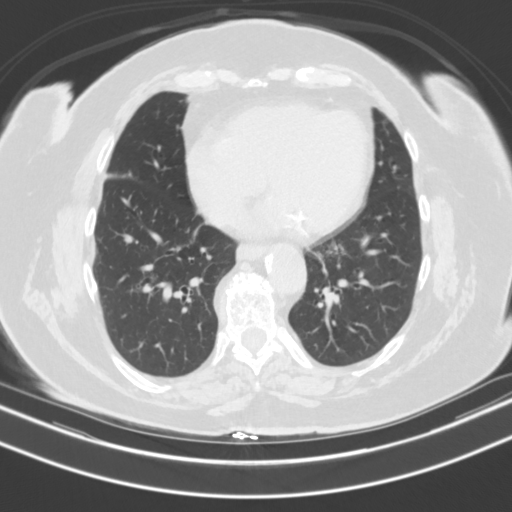
[im 69/155  lung]
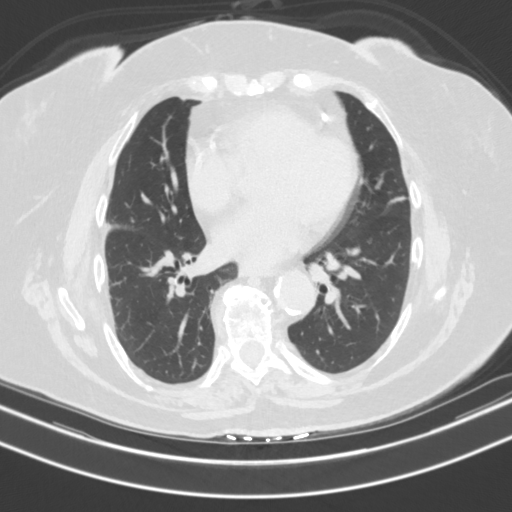
[im 86/155  lung]
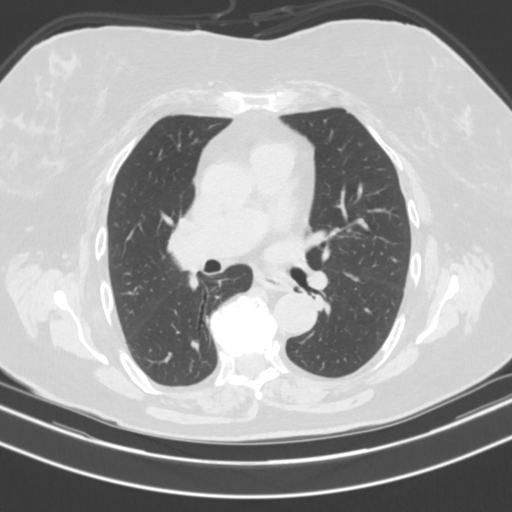
[im 97/155  lung]
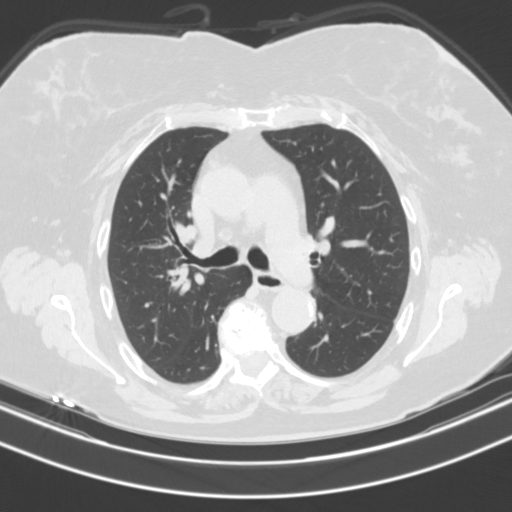
[im 109/155  mediastinal]
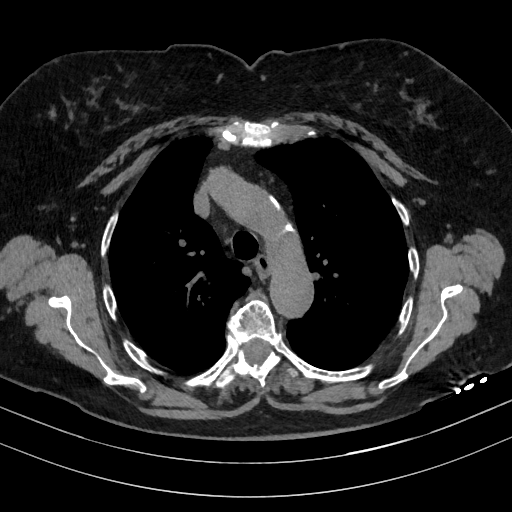
[im 109/155  lung]
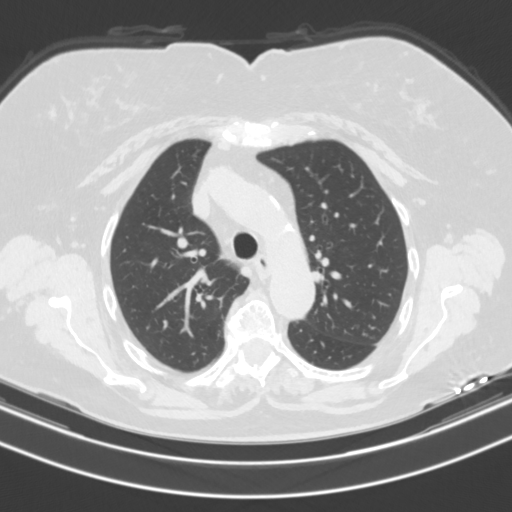
[im 120/155  lung]
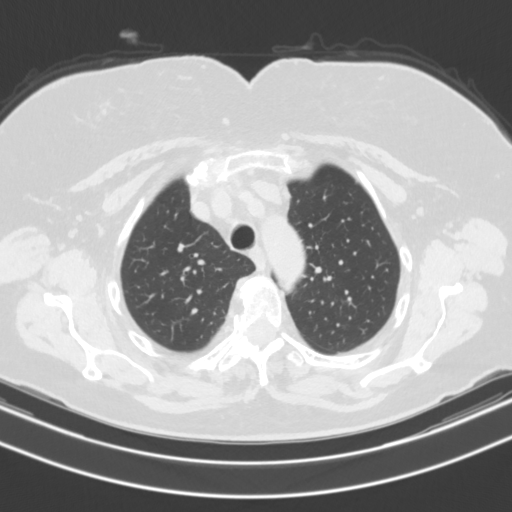
[im 132/155  lung]
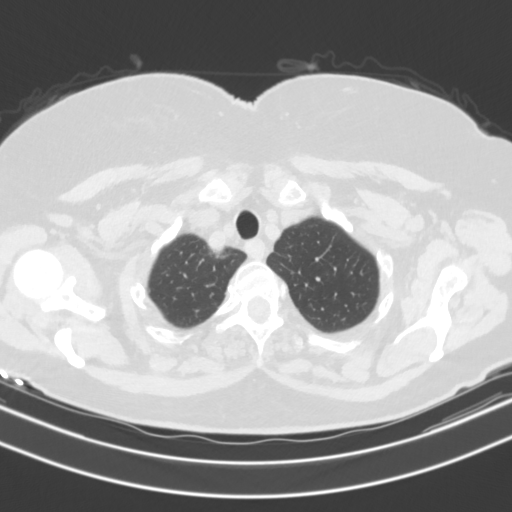
[im 143/155  lung]
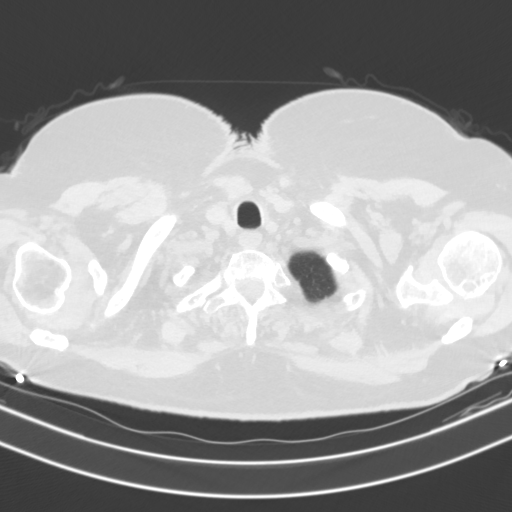

[Series 5: coronal · coronal · 0.62mm/px · 3 of 156 slices shown]
[im 32/156  lung]
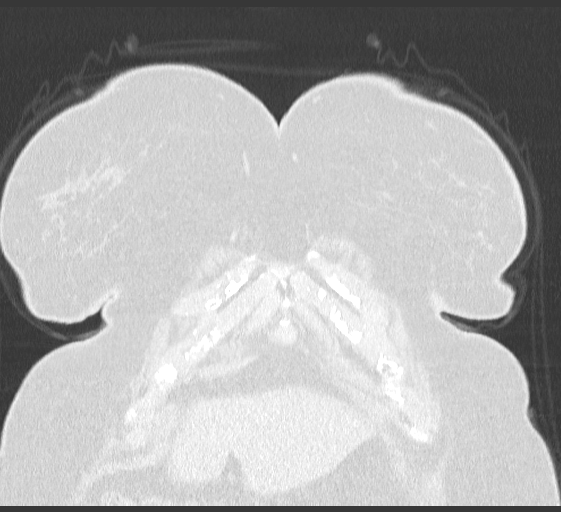
[im 63/156  lung]
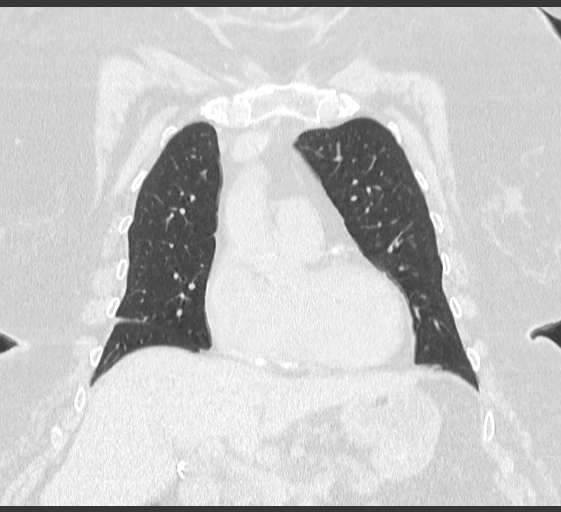
[im 94/156  lung]
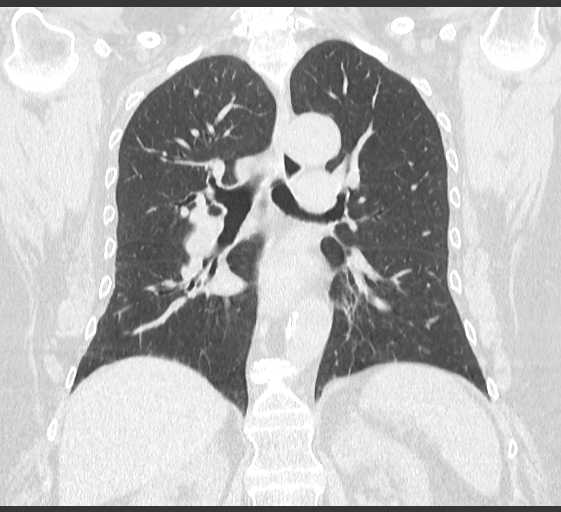

[15 of 36 positions shown; findings below may reference images not displayed]

FINDINGS: LUNGS AND PLEURA: Noncalcified nodule right lower lobe measuring 4 mm and right 
upper lobe measuring 3 mm. Mild volume loss within the lingula. No new pulmonary 
nodules.. No acute consolidation  No effusions. 
MEDIASTINUM: No masses. 
LYMPH NODES: No adenopathy. 
HEART: Normal in size.  No pericardial effusion. Moderate severe coronary artery 
calcification. 
AORTA AND GREAT VESSELS: No aneurysm. 
OSSEOUS STRUCTURES: Degenerative change and kyphosis. No acute fracture or 
destructive lesion. 
UPPER ABDOMEN: Small hiatal hernia. Status post cholecystectomy.
IMPRESSION: Noncalcified pulmonary nodules right upper lobe and right lower lobe the largest 
the right lower lobe measuring 4 are stable. No new pulmonary nodules, acute 
consolidation or effusion. 
Please for to the [HOSPITAL] recommendations for follow-up below. 
No mediastinal or hilar adenopathy. 
Small hiatal hernia . 
Incidental, multiple solid nodules < 6 mm: 
Low Risk: No Routine follow-up. 
High risk: Optional CT at 12 months. 
NOTE: These recommendations do not apply to patients with immunosuppression or 
patients with known primary cancer. 
REFERENCE: [HOSPITAL] 5630 Guidelines for Management of Incidentally 
Detected Pulmonary Nodules in Adults. Radiology 5630. . 
RADIATION DOSE REDUCTION: All CT scans are performed using radiation dose 
reduction techniques, when applicable.  Technical factors are evaluated and 
adjusted to ensure appropriate moderation of exposure.  Automated dose 
management technology is applied to adjust the radiation doses to minimize 
exposure while achieving diagnostic quality images.

## 2020-07-05 MED ORDER — SPIRONOLACTON-HYDROCHLOROTHIAZ 25 MG-25 MG TAB
25-25 mg | ORAL_TABLET | ORAL | 1 refills | Status: AC
Start: 2020-07-05 — End: ?

## 2020-07-05 NOTE — Telephone Encounter (Signed)
Called patient regarding rx request [facility] [place]. Patient reports she is now in [place] and will look for PCP there. Rx send for now.

## 2021-07-21 IMAGING — DX LUMBAR SPINE 2 VIEW
1 series · 2 of 2 positions shown · non-contrast
Comparison: None

________________________________________________________________________________________________ 
LUMBAR SPINE 2 VIEW, PELVIS 1 VIEW, 07/21/2021 [DATE]: 
CLINICAL INDICATION: USW.5 CERVICOCRANIAL SYNDROME, WO3.KO SPONDYLOLISTHESIS 
(accession [id number]), USW.5 CERVICOCRANIAL SYNDROME, WO3.KO SPONDYLOLISTHESIS 
(accession [id number])

[Series 1: AP · U · 0.14mm/px · 2 of 2 slices shown]
[im 1/2]
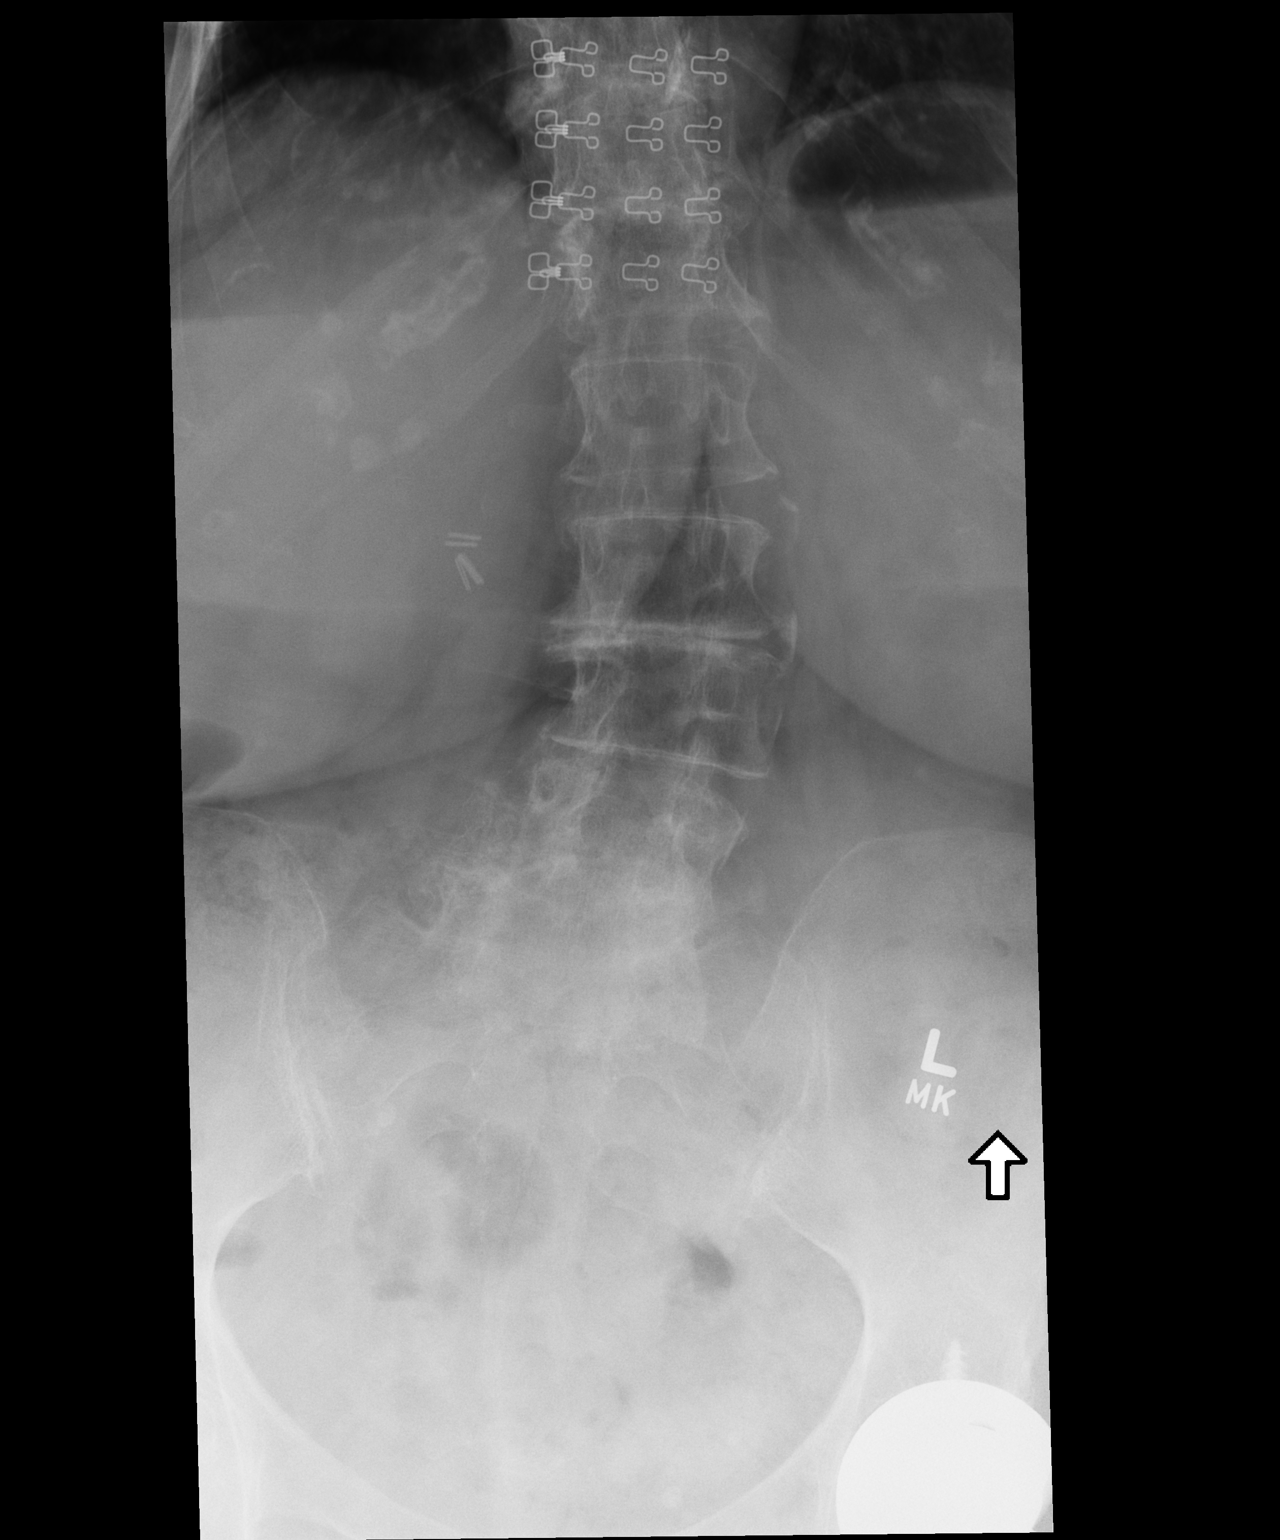
[im 2/2]
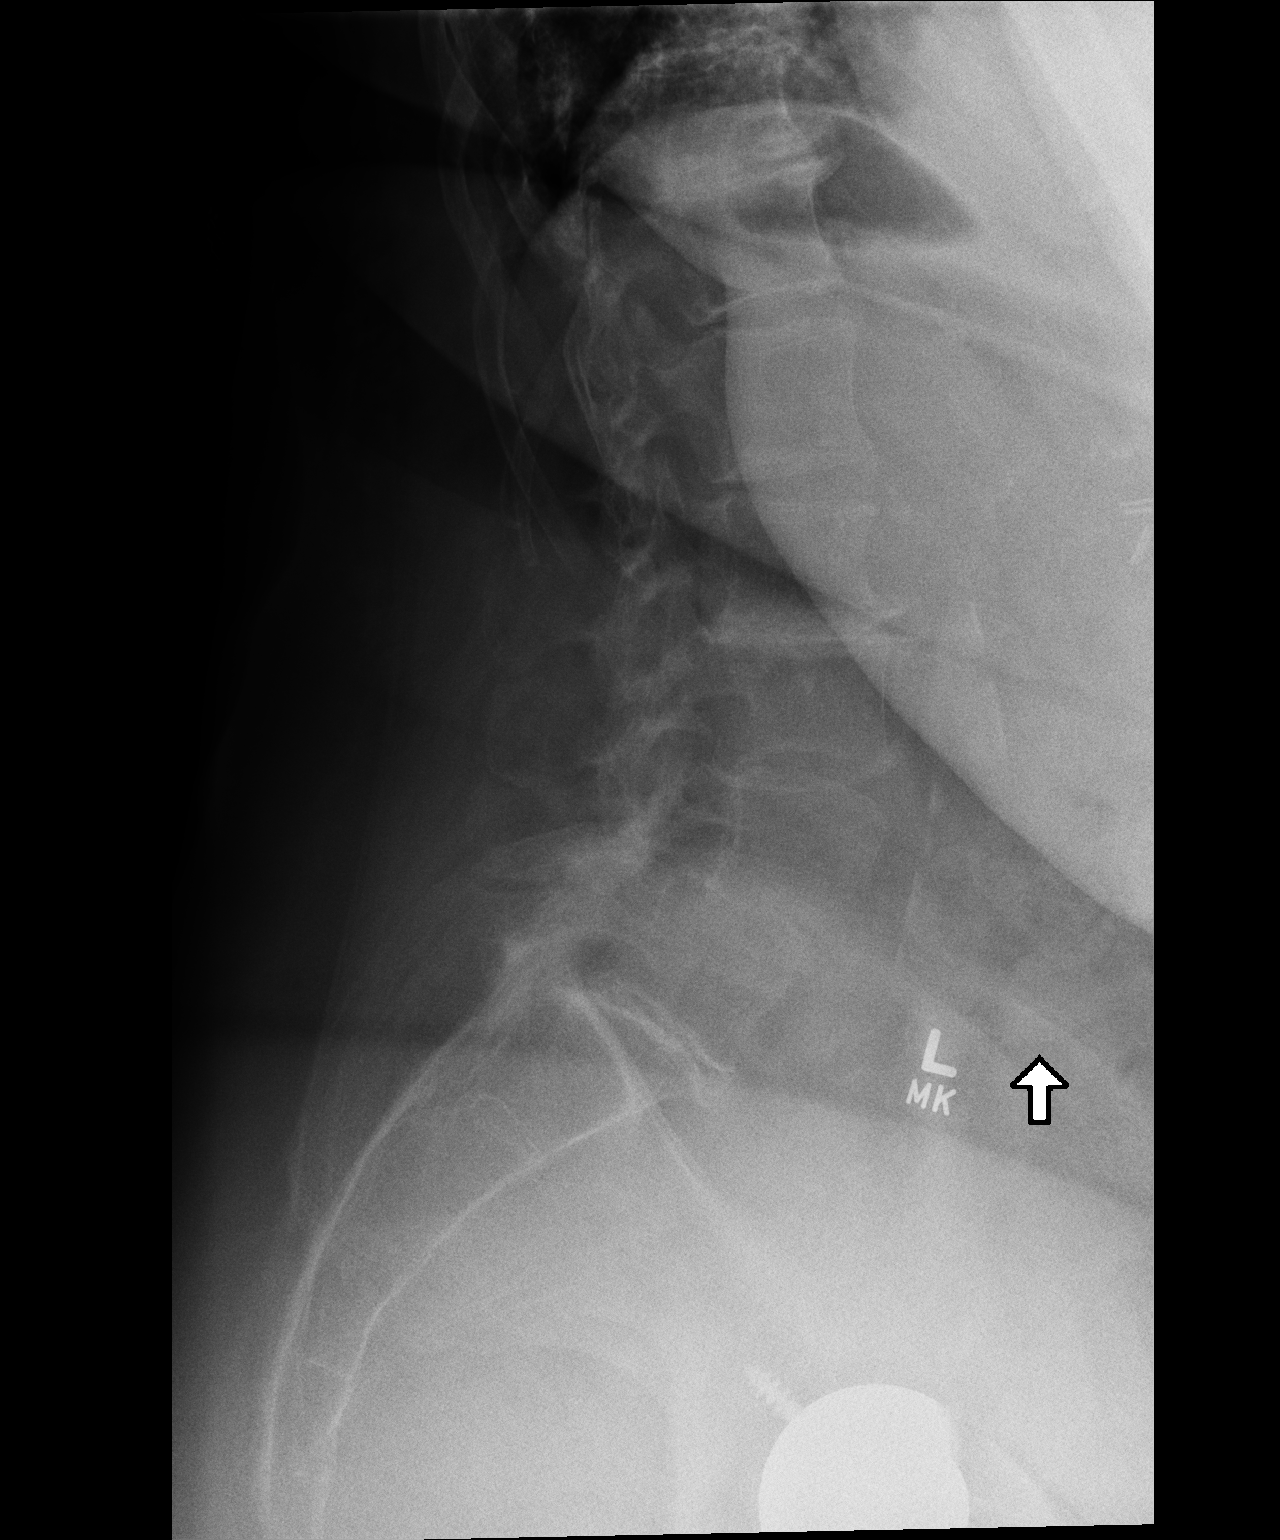

[2 of 2 positions shown; findings below may reference images not displayed]

FINDINGS: Lumbar levocurvature. Grade 1 anterolisthesis L4 on L5. Scattered disc 
space narrowing and osteophytic spurring. Lower lumbar facet hypertrophy. 
Scattered vascular calcifications. Left THA. Bone island right femoral neck is 
suggested. Surgical clips upper abdomen.
IMPRESSION: Lumbar levocurvature and advanced spondylotic changes. If symptoms persist, 
consideration could be made for MR exam.

## 2021-07-21 IMAGING — DX CERVICAL SPINE 2 VIEWS
1 series · 2 of 2 positions shown · non-contrast
Comparison: None prior

________________________________________________________________________________________________ 
CERVICAL SPINE 2 VIEWS, 07/21/2021 [DATE]: 
CLINICAL INDICATION: Spondylolisthesis.

[Series 1: lateral · U · 0.14mm/px · 2 of 2 slices shown]
[im 1/2]
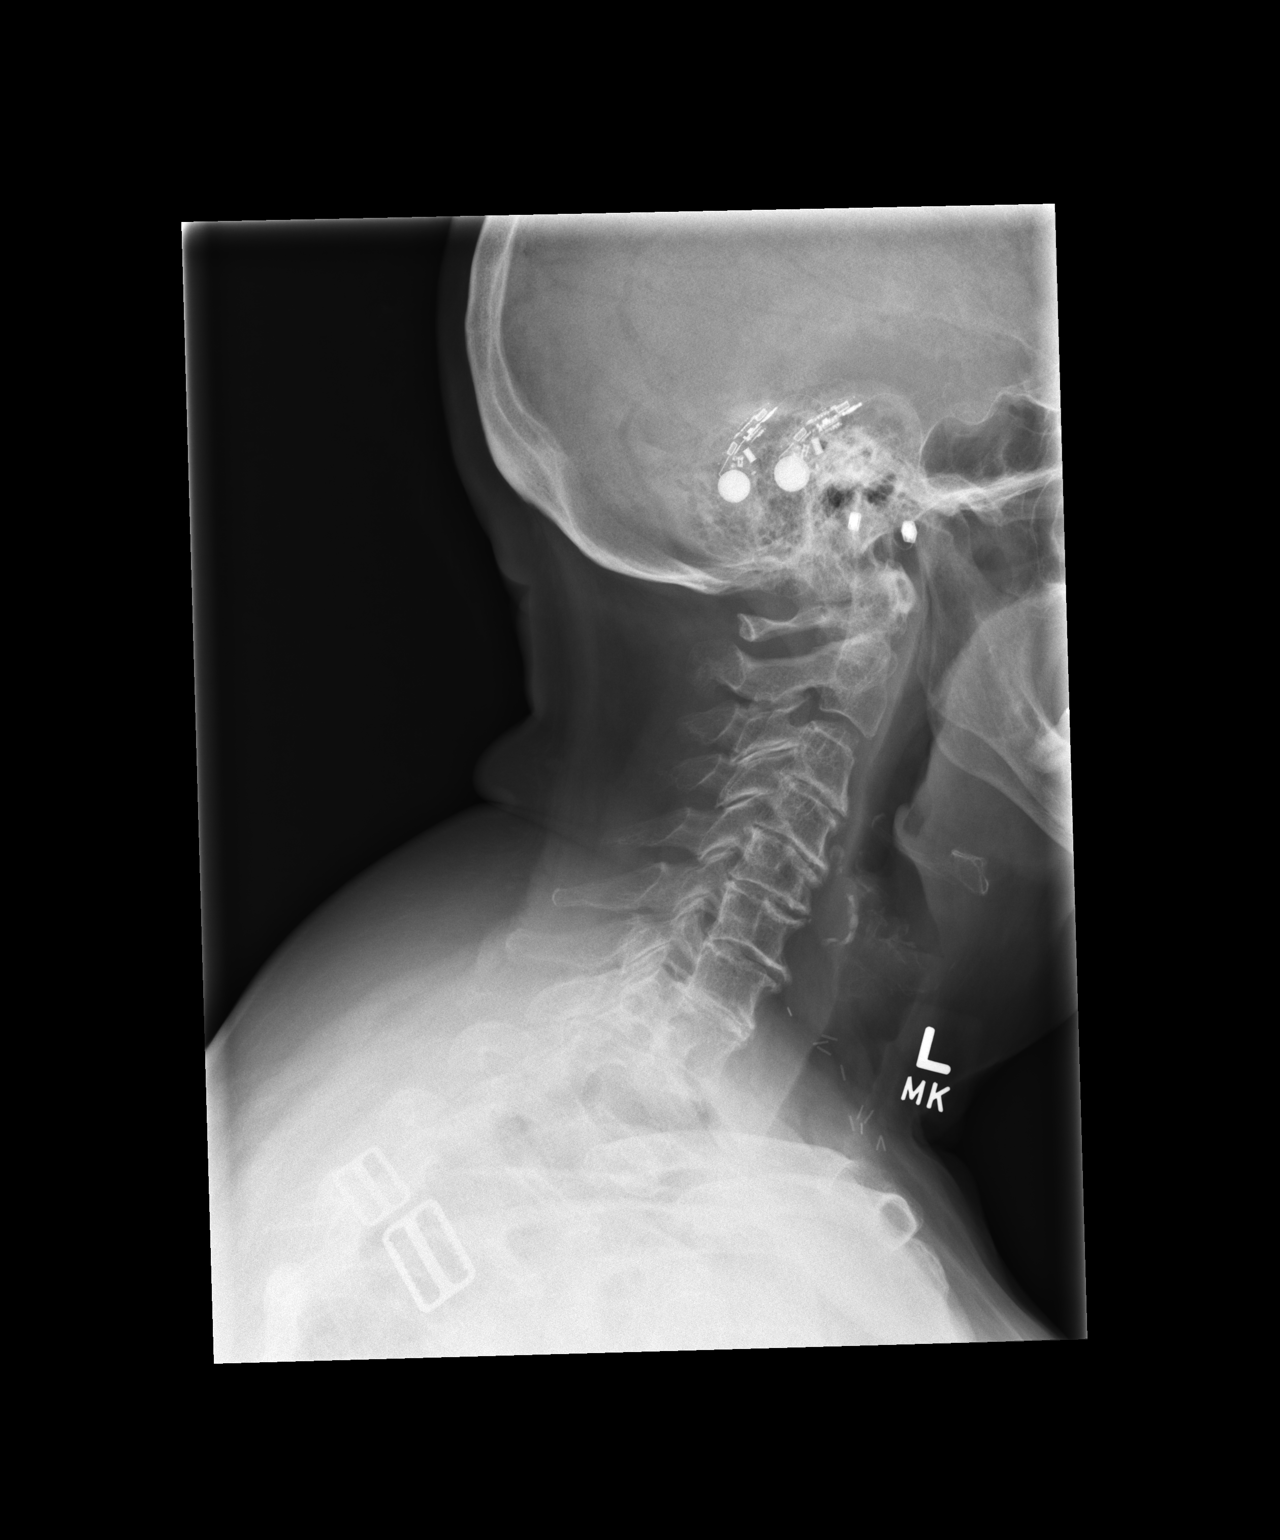
[im 2/2]
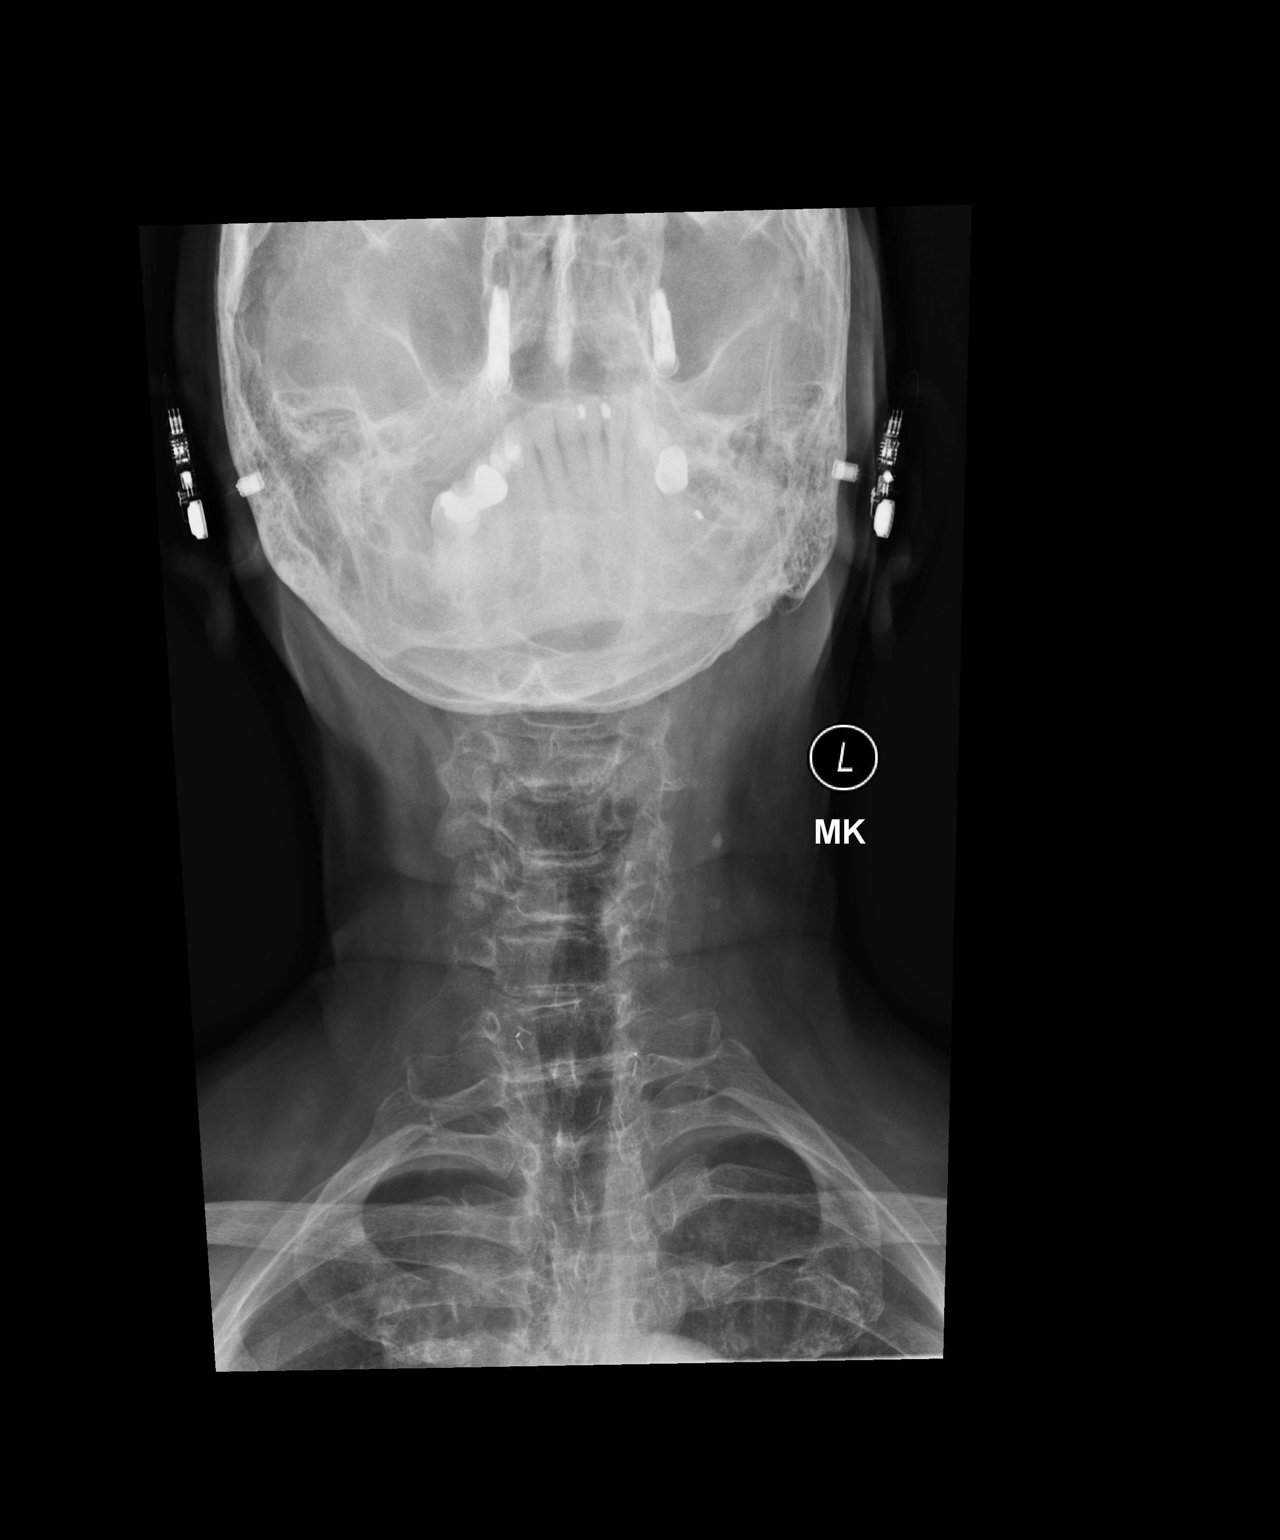

[2 of 2 positions shown; findings below may reference images not displayed]

FINDINGS: Scattered disc space narrowing and osteophytic spurring. Multilevel 
facet hypertrophy. No vertebral body fracture. No spondylolisthesis. Lung apices 
are clear.
IMPRESSION: Advanced spondylotic changes cervical spine. If symptoms persist, consideration 
could be made for MR exam.

## 2021-07-21 IMAGING — DX PELVIS 1 VIEW
1 series · 2 of 2 positions shown · non-contrast
Comparison: None

________________________________________________________________________________________________ 
LUMBAR SPINE 2 VIEW, PELVIS 1 VIEW, 07/21/2021 [DATE]: 
CLINICAL INDICATION: USW.5 CERVICOCRANIAL SYNDROME, WO3.KO SPONDYLOLISTHESIS 
(accession [id number]), USW.5 CERVICOCRANIAL SYNDROME, WO3.KO SPONDYLOLISTHESIS 
(accession [id number])

[Series 1: AP · U · 0.14mm/px · 2 of 2 slices shown]
[im 1/2]
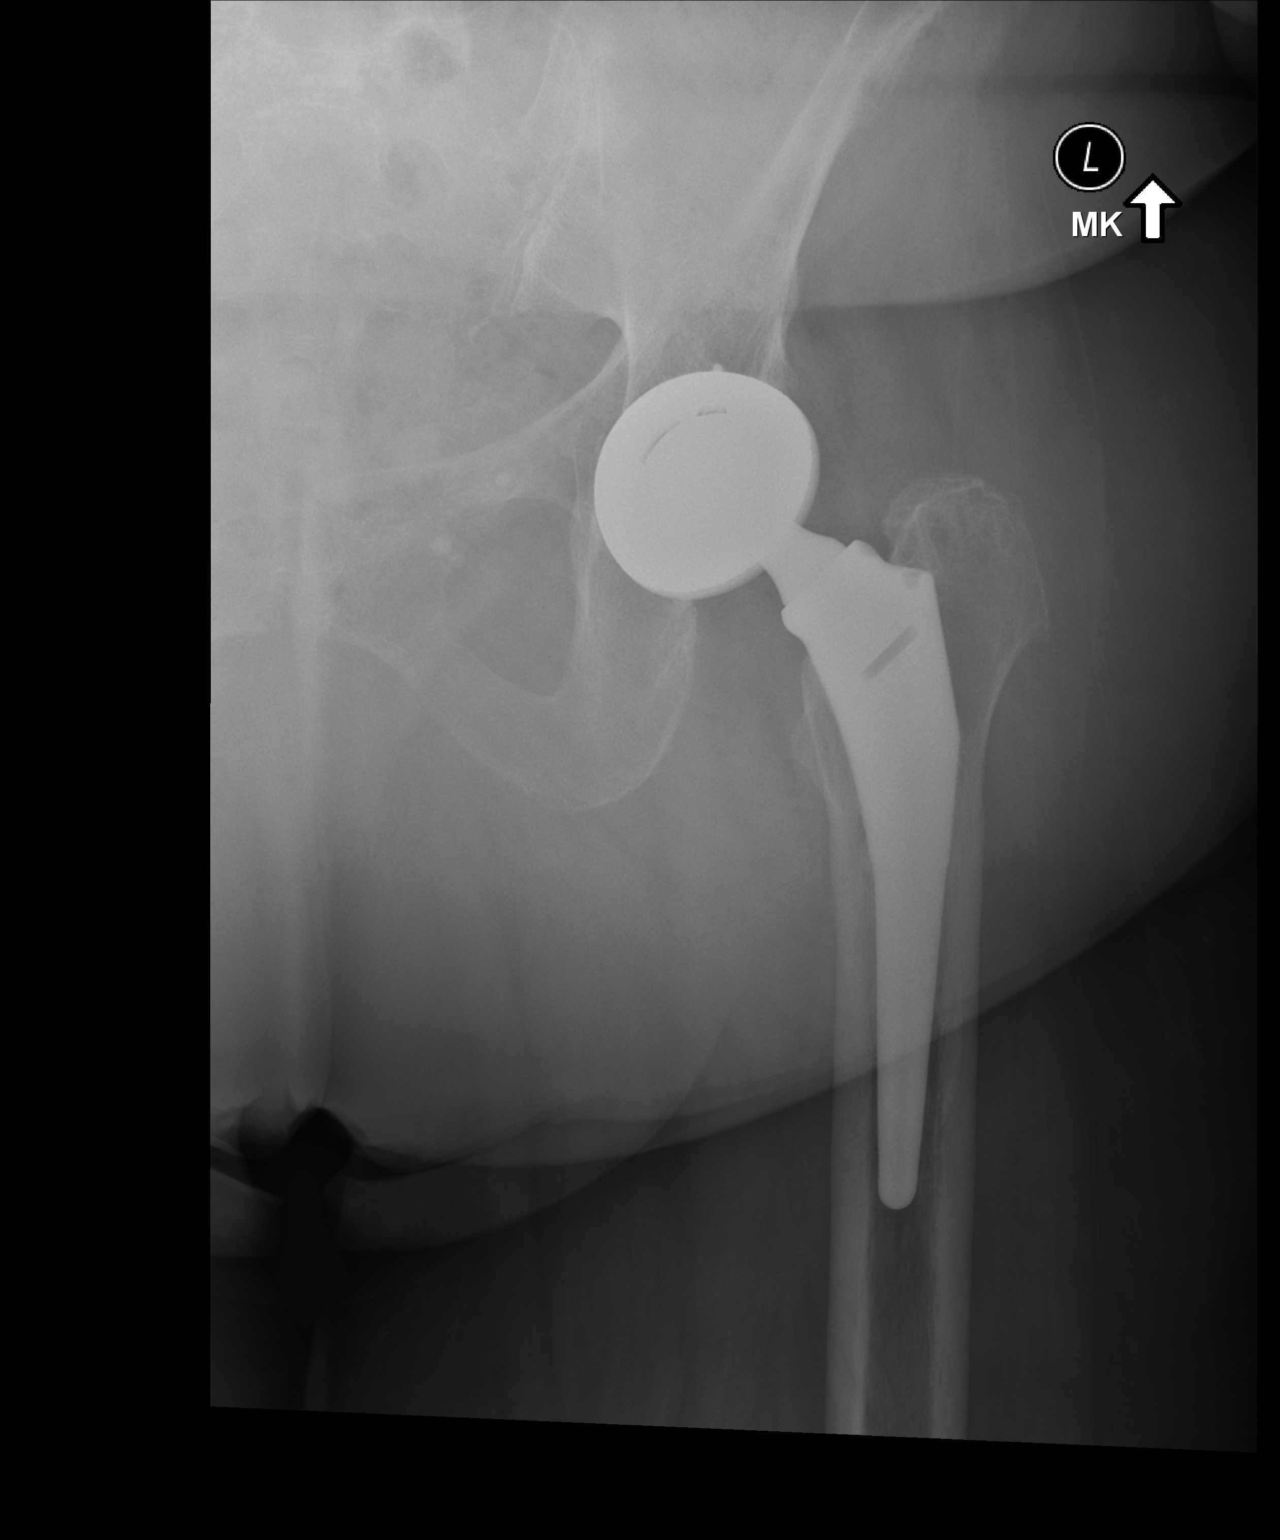
[im 2/2]
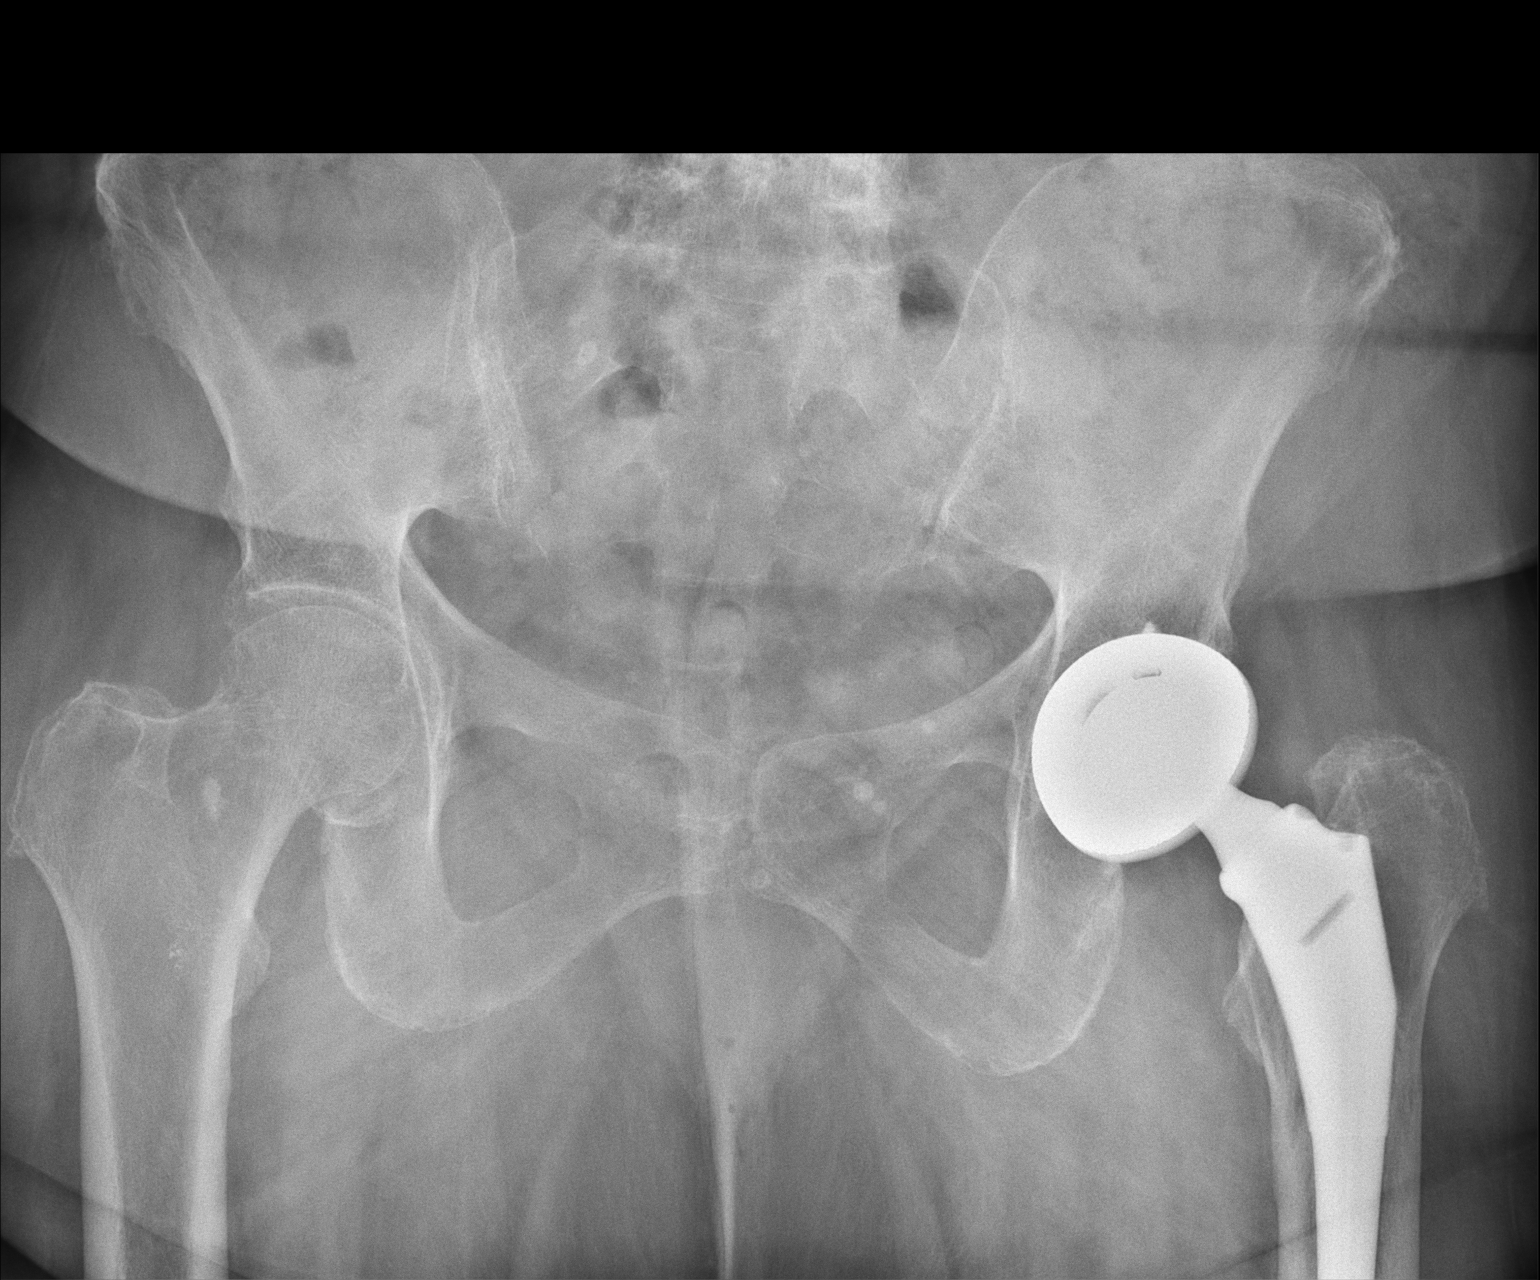

[2 of 2 positions shown; findings below may reference images not displayed]

FINDINGS: Lumbar levocurvature. Grade 1 anterolisthesis L4 on L5. Scattered disc 
space narrowing and osteophytic spurring. Lower lumbar facet hypertrophy. 
Scattered vascular calcifications. Left THA. Bone island right femoral neck is 
suggested. Surgical clips upper abdomen.
IMPRESSION: Lumbar levocurvature and advanced spondylotic changes. If symptoms persist, 
consideration could be made for MR exam.

## 2023-07-16 IMAGING — MR MRI LUMBAR SPINE WITHOUT CONTRAST
5 of 9 series · 10 of 48 positions shown · IV contrast (gadolinium)
Comparison: Lumbar spine x-ray July 11, 2023.

________________________________________________________________________________________________ 
MRI LUMBAR SPINE WITHOUT CONTRAST, 07/16/2023 [DATE]: 
CLINICAL INDICATION: Spinal Stenosis, Lumbar Region With Neurogenic 
Claudication. Spinal Stenosis, Lumbar Region Without Neurogenic Claudication , 
trauma 4 months ago. Low back pain. History of skin cancer.
TECHNIQUE: Multiplanar, multiecho position MR images of the lumbar spine were 
performed without intravenous gadolinium enhancement.

[Series 101: survey · axial · 10.0mm · 1.39mm/px · 1 of 9 slices shown]
[im 1/9]
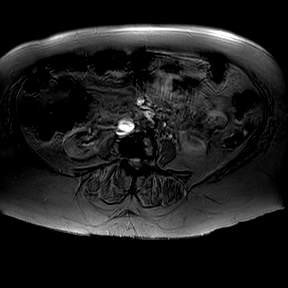

[Series 201: t2w_cor-surv/ls · coronal · 6.0mm · 0.50mm/px · 1 of 5 slices shown]
[im 1/5]
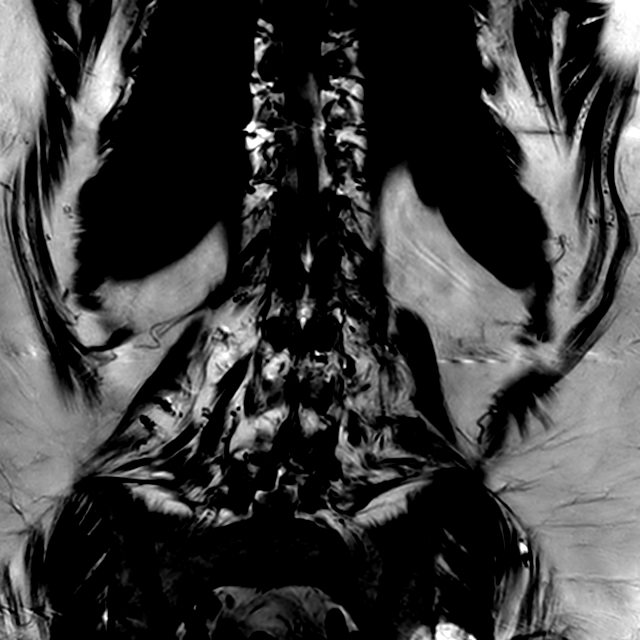

[Series 301: t1w_tse sag · sagittal · 4.2mm · 0.23mm/px · 2 of 17 slices shown]
[im 1/17]
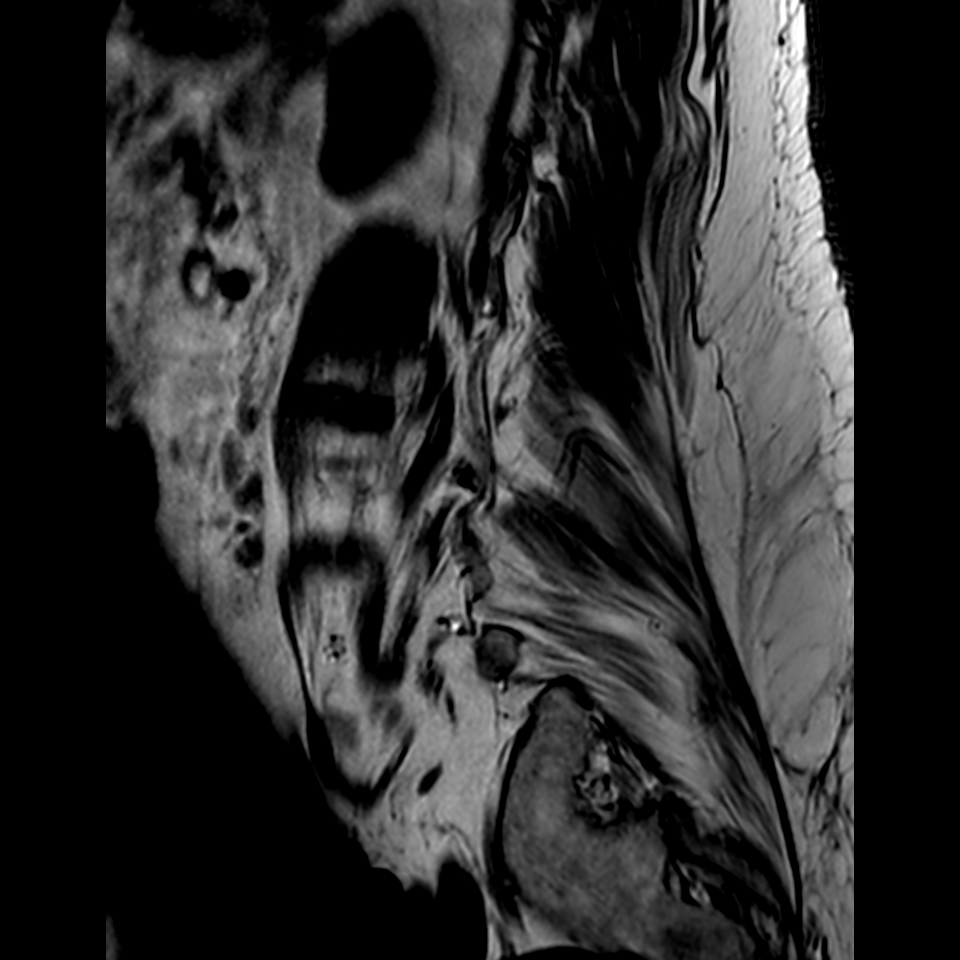
[im 17/17]
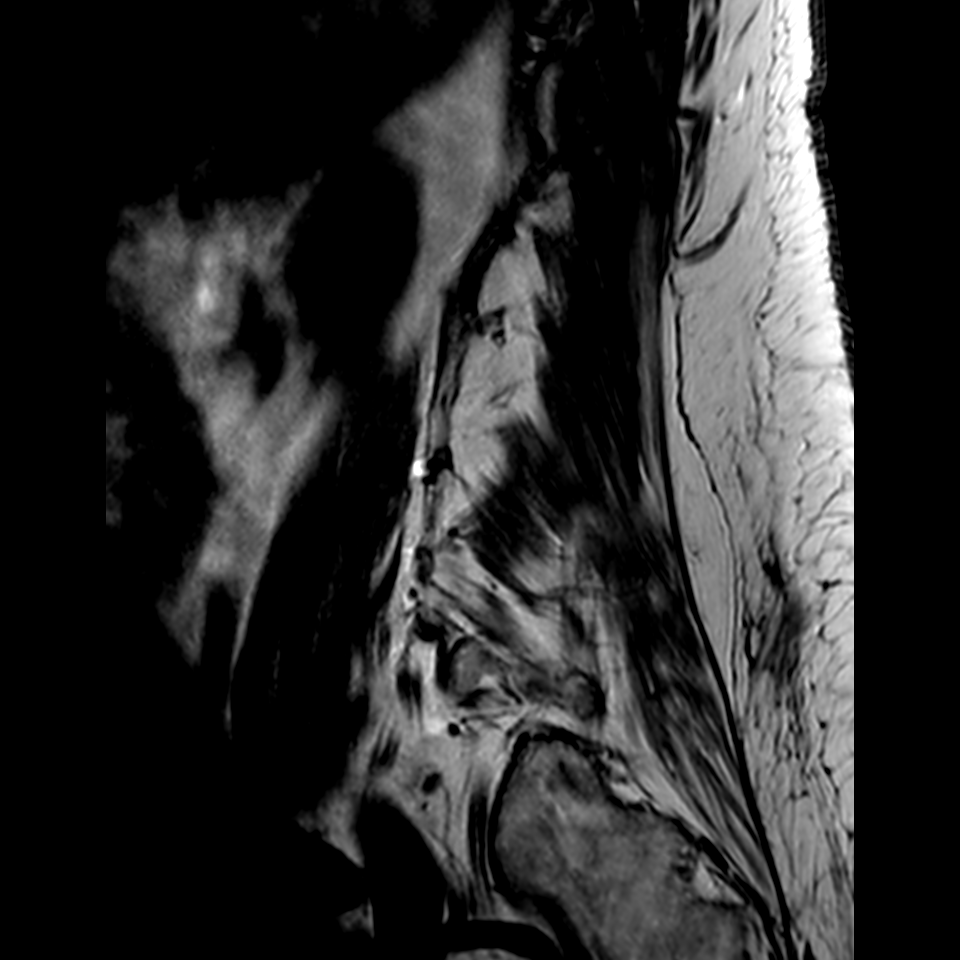

[Series 401: t2w_tse sag · sagittal · 4.2mm · 0.25mm/px · 1 of 17 slices shown]
[im 1/17]
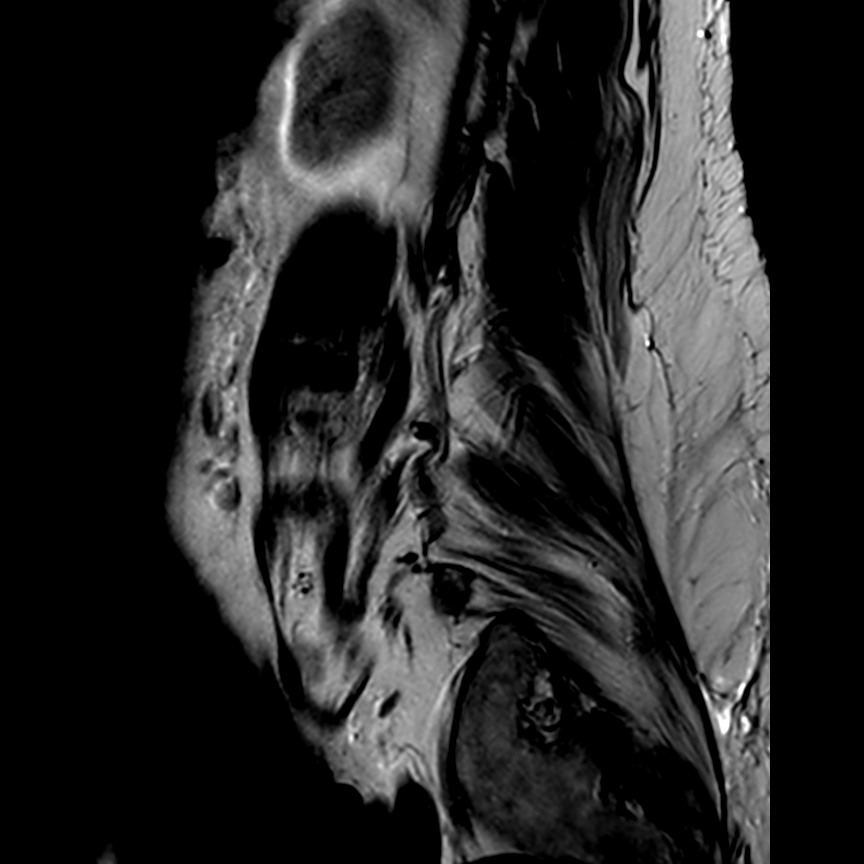

[Series 801: T1 · axial · 5.0mm · 0.35mm/px · z∈[-98,+92]mm · 5 of 33 slices shown]
[im 1/33]
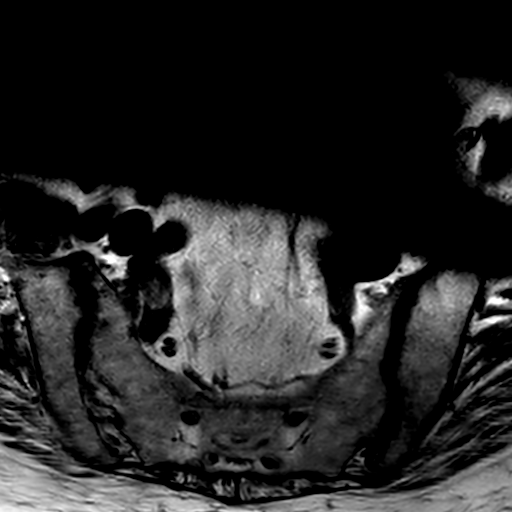
[im 9/33]
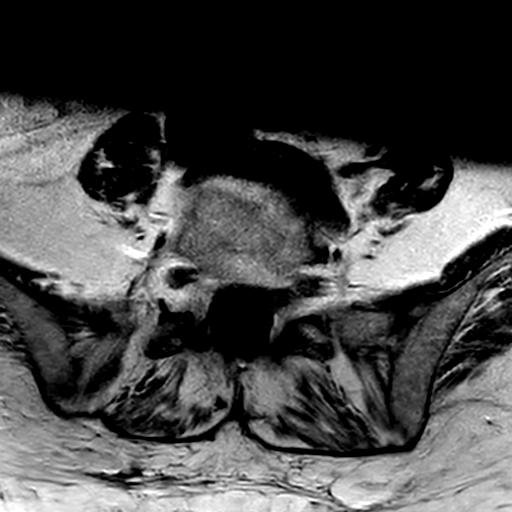
[im 17/33]
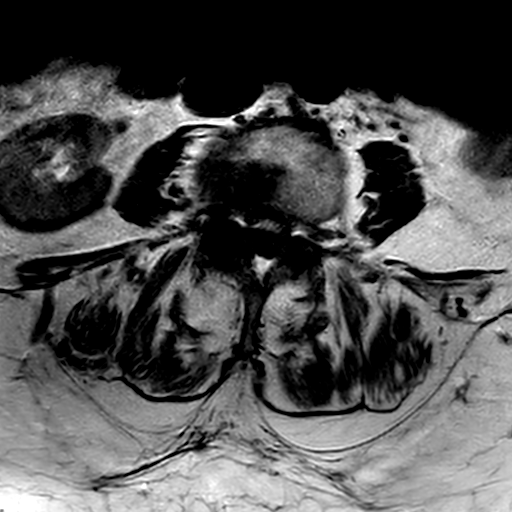
[im 25/33]
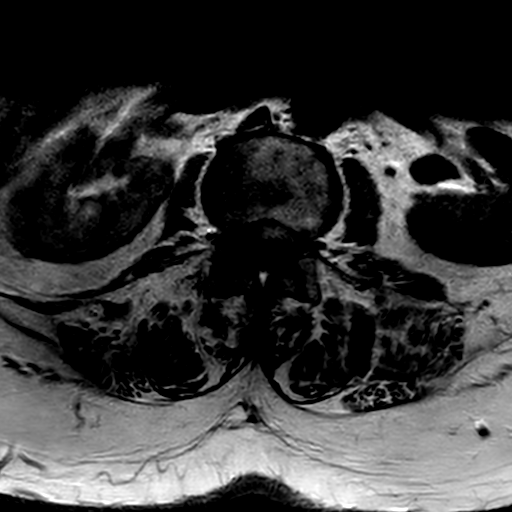
[im 33/33]
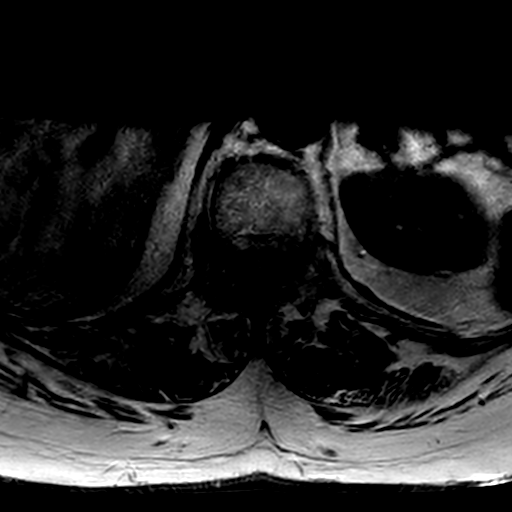

[10 of 48 positions shown; findings below may reference images not displayed]

MRI right hip April 19, 2023. 
CT pelvis April 17, 20[address] CT lumbar spine from the same date.
FINDINGS: -------------------------------------------------------------------------------- 
------ 
GENERAL: 
Nomenclature is based on 5 lumbar type vertebral bodies.     
ALIGNMENT: Mild levoconvex lumbar scoliosis. Grade 1 anterolisthesis L4 on L5 is 
degenerative. Grade 1 anterolisthesis L5 on S1. Stable. 
VERTEBRAL BODY HEIGHT: Chronic mild to moderate superior endplate compression 
fracture deformity of L1. There is a fracture lucency that parallels the 
superior endplate and extends to the posterior vertebral body cortex. 3.5 mm 
posterior cortical buckling but without significant canal stenosis. Amount of 
height loss is stable to the lumbar spine x-ray July 11, 2023, although 
fracture is new since CT April 17, 2023. No marrow edema.  
MARROW SIGNAL: No focal suspect signal abnormality. 
CORD SIGNAL: Normal distal spinal cord and cauda equina. Conus medullaris 
terminates at T12-L1. 
ADDITIONAL FINDINGS: Left hip arthroplasty. 
Modic I-II: L2-L3, L4-L5 
Ligamentum Flavum > 2.5 mm: All levels. 
-------------------------------------------------------------------------------- 
------ 
SEGMENTAL: 
T12-L1: Ballooning of the disc. Vacuum disc phenomenon. Loss of disc signal. 
Facet arthropathy. Canal and left foramen patent. Small size central disc 
extrusion with disc material extending superiorly. Right foramen is borderline 
narrowed. 
L1-L2: Loss of disc signal with minimal annular bulge. Small Schmorls node. 
Canal and foramina are patent. 
L2-L3: Severe loss of disc height with loss of disc signal. Posterior ridging 
with mild annular bulge and borderline canal stenosis with mild narrowing 
lateral recesses bilaterally. Patent left foramen. Mild right foraminal 
narrowing. 
L3-L4: Loss of disc signal with degenerative endplate irregularity and mild 
canal stenosis with mild lateral recess narrowing bilaterally. Facet 
arthropathy. Moderate to severe right and moderate left foraminal narrowing. 
L4-L5: Loss of disc height. There is some fluid within the disc space. Vacuum 
disc phenomenon. Endplates otherwise demonstrate mild degenerative type changes. 
Disc uncovering. Moderate to moderately severe canal stenosis with significant 
lateral recess narrowing bilaterally. Right paracentral annular fissure. Severe 
right and moderate left foraminal narrowing with facet arthropathy. 
L5-S1: Loss of disc height and signal with patent canal and facet arthropathy. 
Foramina patent. 
-------------------------------------------------------------------------------- 
------
IMPRESSION: Chronic L1 compression fracture deformity as detailed above, new since CT 
Multilevel lumbar spondylosis with most significant canal stenosis L4-L5, 
moderate to moderately severe with signal and lateral recess narrowing 
bilaterally.
